# Patient Record
Sex: Male | Born: 1950 | Race: Black or African American | Hispanic: No | Marital: Married | Smoking: Former smoker
Health system: Southern US, Community
[De-identification: ages and names within clinical notes are randomized; demographics above are authoritative.]

## PROBLEM LIST (undated history)

## (undated) ENCOUNTER — Emergency Department (HOSPITAL_COMMUNITY): Payer: No Typology Code available for payment source | Source: Home / Self Care

## (undated) ENCOUNTER — Emergency Department (HOSPITAL_COMMUNITY): Discharge status: Absent without leave | Payer: Self-pay

## (undated) DIAGNOSIS — I10 Essential (primary) hypertension: Secondary | ICD-10-CM

## (undated) DIAGNOSIS — M5030 Other cervical disc degeneration, unspecified cervical region: Secondary | ICD-10-CM

## (undated) HISTORY — PX: NO PAST SURGERIES: SHX2092

---

## 2001-01-11 ENCOUNTER — Emergency Department (HOSPITAL_COMMUNITY): Admission: EM | Admit: 2001-01-11 | Discharge: 2001-01-11 | Payer: Self-pay | Admitting: *Deleted

## 2001-09-23 ENCOUNTER — Encounter: Payer: Self-pay | Admitting: *Deleted

## 2001-09-23 ENCOUNTER — Emergency Department (HOSPITAL_COMMUNITY): Admission: EM | Admit: 2001-09-23 | Discharge: 2001-09-23 | Payer: Self-pay | Admitting: *Deleted

## 2002-01-04 ENCOUNTER — Encounter: Admission: RE | Admit: 2002-01-04 | Discharge: 2002-04-04 | Payer: Self-pay | Admitting: Family Medicine

## 2007-09-24 ENCOUNTER — Emergency Department (HOSPITAL_COMMUNITY): Admission: EM | Admit: 2007-09-24 | Discharge: 2007-09-24 | Payer: Self-pay | Admitting: *Deleted

## 2007-09-29 ENCOUNTER — Emergency Department (HOSPITAL_COMMUNITY): Admission: EM | Admit: 2007-09-29 | Discharge: 2007-09-29 | Payer: Self-pay | Admitting: Emergency Medicine

## 2007-10-03 ENCOUNTER — Emergency Department (HOSPITAL_COMMUNITY): Admission: EM | Admit: 2007-10-03 | Discharge: 2007-10-03 | Payer: Self-pay | Admitting: Emergency Medicine

## 2007-11-22 ENCOUNTER — Emergency Department (HOSPITAL_COMMUNITY): Admission: EM | Admit: 2007-11-22 | Discharge: 2007-11-22 | Payer: Self-pay | Admitting: Emergency Medicine

## 2008-08-19 ENCOUNTER — Emergency Department (HOSPITAL_COMMUNITY): Admission: EM | Admit: 2008-08-19 | Discharge: 2008-08-19 | Payer: Self-pay | Admitting: Emergency Medicine

## 2009-04-06 ENCOUNTER — Emergency Department (HOSPITAL_COMMUNITY): Admission: EM | Admit: 2009-04-06 | Discharge: 2009-04-07 | Payer: Self-pay | Admitting: Emergency Medicine

## 2009-04-08 ENCOUNTER — Emergency Department (HOSPITAL_COMMUNITY): Admission: EM | Admit: 2009-04-08 | Discharge: 2009-04-08 | Payer: Self-pay | Admitting: Emergency Medicine

## 2009-04-19 ENCOUNTER — Emergency Department (HOSPITAL_COMMUNITY): Admission: EM | Admit: 2009-04-19 | Discharge: 2009-04-19 | Payer: Self-pay | Admitting: Emergency Medicine

## 2009-04-27 ENCOUNTER — Emergency Department (HOSPITAL_COMMUNITY): Admission: EM | Admit: 2009-04-27 | Discharge: 2009-04-27 | Payer: Self-pay | Admitting: Emergency Medicine

## 2009-10-06 ENCOUNTER — Emergency Department (HOSPITAL_COMMUNITY): Admission: EM | Admit: 2009-10-06 | Discharge: 2009-10-07 | Payer: Self-pay | Admitting: Emergency Medicine

## 2009-11-12 ENCOUNTER — Emergency Department (HOSPITAL_COMMUNITY): Admission: EM | Admit: 2009-11-12 | Discharge: 2009-11-12 | Payer: Self-pay | Admitting: Family Medicine

## 2009-12-23 ENCOUNTER — Emergency Department (HOSPITAL_COMMUNITY): Admission: EM | Admit: 2009-12-23 | Discharge: 2009-12-23 | Payer: Self-pay | Admitting: Emergency Medicine

## 2010-06-04 ENCOUNTER — Emergency Department (HOSPITAL_COMMUNITY): Admission: EM | Admit: 2010-06-04 | Discharge: 2010-06-04 | Payer: Self-pay | Admitting: Emergency Medicine

## 2010-08-19 ENCOUNTER — Emergency Department (HOSPITAL_COMMUNITY): Admission: EM | Admit: 2010-08-19 | Discharge: 2010-08-19 | Payer: Self-pay | Admitting: Emergency Medicine

## 2010-09-05 ENCOUNTER — Emergency Department (HOSPITAL_COMMUNITY): Admission: EM | Admit: 2010-09-05 | Discharge: 2010-09-05 | Payer: Self-pay | Admitting: Emergency Medicine

## 2011-02-20 LAB — POCT I-STAT, CHEM 8
Calcium, Ion: 1.14 mmol/L (ref 1.12–1.32)
Creatinine, Ser: 0.9 mg/dL (ref 0.4–1.5)
Glucose, Bld: 88 mg/dL (ref 70–99)
HCT: 48 % (ref 33.0–44.0)
Hemoglobin: 16.3 g/dL (ref 11.0–14.6)
Sodium: 139 mEq/L (ref 135–145)

## 2011-02-23 LAB — GLUCOSE, CAPILLARY: Glucose-Capillary: 245 mg/dL — ABNORMAL HIGH (ref 70–99)

## 2011-03-11 LAB — POCT URINALYSIS DIP (DEVICE)
Glucose, UA: 500 mg/dL — AB
Protein, ur: NEGATIVE mg/dL
Urobilinogen, UA: 0.2 mg/dL (ref 0.0–1.0)

## 2011-03-11 LAB — POCT I-STAT, CHEM 8
Calcium, Ion: 1.19 mmol/L (ref 1.12–1.32)
Chloride: 96 mEq/L (ref 96–112)
Glucose, Bld: 412 mg/dL — ABNORMAL HIGH (ref 70–99)
HCT: 50 % (ref 33.0–44.0)
Hemoglobin: 17 g/dL (ref 11.0–14.6)
Potassium: 4.1 mEq/L (ref 3.5–5.1)

## 2012-04-25 ENCOUNTER — Encounter (HOSPITAL_COMMUNITY): Payer: Self-pay | Admitting: Physical Medicine and Rehabilitation

## 2012-04-25 ENCOUNTER — Emergency Department (HOSPITAL_COMMUNITY): Payer: No Typology Code available for payment source

## 2012-04-25 ENCOUNTER — Emergency Department (HOSPITAL_COMMUNITY)
Admission: EM | Admit: 2012-04-25 | Discharge: 2012-04-25 | Disposition: A | Discharge status: Home / Self Care | Payer: No Typology Code available for payment source | Attending: Emergency Medicine | Admitting: Emergency Medicine

## 2012-04-25 DIAGNOSIS — R350 Frequency of micturition: Secondary | ICD-10-CM | POA: Insufficient documentation

## 2012-04-25 DIAGNOSIS — Z7982 Long term (current) use of aspirin: Secondary | ICD-10-CM | POA: Insufficient documentation

## 2012-04-25 DIAGNOSIS — R011 Cardiac murmur, unspecified: Secondary | ICD-10-CM | POA: Insufficient documentation

## 2012-04-25 DIAGNOSIS — M545 Low back pain, unspecified: Secondary | ICD-10-CM | POA: Insufficient documentation

## 2012-04-25 DIAGNOSIS — R209 Unspecified disturbances of skin sensation: Secondary | ICD-10-CM | POA: Insufficient documentation

## 2012-04-25 DIAGNOSIS — Z79899 Other long term (current) drug therapy: Secondary | ICD-10-CM | POA: Insufficient documentation

## 2012-04-25 DIAGNOSIS — R51 Headache: Secondary | ICD-10-CM | POA: Insufficient documentation

## 2012-04-25 DIAGNOSIS — E119 Type 2 diabetes mellitus without complications: Secondary | ICD-10-CM | POA: Insufficient documentation

## 2012-04-25 DIAGNOSIS — I10 Essential (primary) hypertension: Secondary | ICD-10-CM | POA: Insufficient documentation

## 2012-04-25 DIAGNOSIS — M542 Cervicalgia: Secondary | ICD-10-CM | POA: Insufficient documentation

## 2012-04-25 DIAGNOSIS — S161XXA Strain of muscle, fascia and tendon at neck level, initial encounter: Secondary | ICD-10-CM

## 2012-04-25 DIAGNOSIS — S139XXA Sprain of joints and ligaments of unspecified parts of neck, initial encounter: Secondary | ICD-10-CM | POA: Insufficient documentation

## 2012-04-25 HISTORY — DX: Essential (primary) hypertension: I10

## 2012-04-25 MED ORDER — DIAZEPAM 5 MG PO TABS: 5.0000 mg | ORAL_TABLET | Freq: Every day | ORAL | Status: DC

## 2012-04-25 MED ORDER — IBUPROFEN 800 MG PO TABS
800.0000 mg | ORAL_TABLET | Freq: Once | ORAL | Status: AC
  Administered 2012-04-25: 800 mg via ORAL
  Filled 2012-04-25: qty 1

## 2012-04-25 MED ORDER — IBUPROFEN 800 MG PO TABS: 800.0000 mg | ORAL_TABLET | Freq: Three times a day (TID) | ORAL | Status: AC
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
## 2012-04-28 ENCOUNTER — Emergency Department (HOSPITAL_COMMUNITY): Payer: No Typology Code available for payment source

## 2012-04-28 ENCOUNTER — Encounter (HOSPITAL_COMMUNITY): Payer: Self-pay | Admitting: Emergency Medicine

## 2012-04-28 ENCOUNTER — Emergency Department (HOSPITAL_COMMUNITY)
Admission: EM | Admit: 2012-04-28 | Discharge: 2012-04-28 | Disposition: A | Discharge status: Home / Self Care | Payer: No Typology Code available for payment source | Attending: Emergency Medicine | Admitting: Emergency Medicine

## 2012-04-28 DIAGNOSIS — M545 Low back pain, unspecified: Secondary | ICD-10-CM | POA: Insufficient documentation

## 2012-04-28 DIAGNOSIS — M549 Dorsalgia, unspecified: Secondary | ICD-10-CM

## 2012-04-28 DIAGNOSIS — I10 Essential (primary) hypertension: Secondary | ICD-10-CM | POA: Insufficient documentation

## 2012-04-28 DIAGNOSIS — R51 Headache: Secondary | ICD-10-CM | POA: Insufficient documentation

## 2012-04-28 DIAGNOSIS — E119 Type 2 diabetes mellitus without complications: Secondary | ICD-10-CM | POA: Insufficient documentation

## 2012-04-28 LAB — URINALYSIS, ROUTINE W REFLEX MICROSCOPIC
Ketones, ur: NEGATIVE mg/dL
Leukocytes, UA: NEGATIVE
Nitrite: NEGATIVE
Protein, ur: NEGATIVE mg/dL
Urobilinogen, UA: 1 mg/dL (ref 0.0–1.0)

## 2012-04-28 MED ORDER — NAPROXEN 500 MG PO TABS: 500.0000 mg | ORAL_TABLET | Freq: Two times a day (BID) | ORAL | Status: DC

## 2012-04-28 MED ORDER — NAPROXEN 500 MG PO TABS
500.0000 mg | ORAL_TABLET | Freq: Once | ORAL | Status: AC
  Administered 2012-04-28: 500 mg via ORAL
  Filled 2012-04-28: qty 1

## 2012-04-28 MED ORDER — METHOCARBAMOL 500 MG PO TABS: 500.0000 mg | ORAL_TABLET | Freq: Four times a day (QID) | ORAL | Status: AC

## 2012-04-28 MED ORDER — TRAMADOL HCL 50 MG PO TABS: 50.0000 mg | ORAL_TABLET | Freq: Four times a day (QID) | ORAL | Status: AC | PRN

## 2012-04-28 NOTE — ED Notes (Signed)
Patient transported to X-ray 
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
## 2012-04-28 NOTE — Discharge Instructions (Signed)
Your back pain should be treated with medicines such as ibuprofen or aleve and this back pain should get better over the next 2 weeks.  However if you develop severe or worsening pain, low back pain with fever, numbness, weakness or inability to walk or urinate, you should return to the ER immediately.  Please follow up with your doctor this week for a recheck if still having symptoms.  Your x-ray was normal, your urine test showed no signs of infection. Please see the list of physicians below to followup as an outpatient.  RESOURCE GUIDE  Dental Problems  Patients with Medicaid: [facility] [phone] [address]                                           [phone] [address] Phone:  [phone]                                                  Phone:  [phone]  If unable to pay or uninsured, contact:  [facility] or [facility]. to become qualified for the adult dental clinic.  Chronic Pain Problems Contact [facility]  [phone] Patients need to be referred by their primary care doctor.  Insufficient Money for Medicine Contact [employer]:  call "211" or [facility] [phone].  No Primary Care Doctor Call [facility]  [phone] Other agencies that provide inexpensive medical care    [facility]  [phone]    [facility]  [phone]    [facility]  [phone]    [facility]  [phone]    [facility]  [phone]    [facility]  [phone]  Psychological Services [facility]  [phone] [facility]  [phone] [facility]   [phone] (emergency services [phone])  Substance Abuse Resources [facility]  2706533546 [facility] [phone] [facility] [place] [phone] Floydene Flock [phone] Residential & Outpatient Substance Abuse Program   [phone]  Abuse/Neglect [facility] Child Abuse Hotline 760-080-3104 [facility] Child Abuse Hotline 878-869-0669 (After Hours)  Emergency Shelter [facility] [phone]  Maternity Homes [facility] [place] [facility] [place] 5097664711 [facility] [phone]  MRSA Hotline #:   [phone]    [facility] Resources  [facility] [place]     [employer]                          [facility] Dept. [address] [place]                       [address]      [address]  [address]  Sela Hua Phone:  [phone]                                   Phone:  [phone]                 Phone:  [place] Phone:  [place] [phone] (After Hours)
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
## 2012-04-28 NOTE — ED Notes (Signed)
Urine specimen sent to main lab.
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
## 2012-07-01 ENCOUNTER — Encounter (HOSPITAL_COMMUNITY): Payer: Self-pay | Admitting: *Deleted

## 2012-07-01 ENCOUNTER — Emergency Department (HOSPITAL_COMMUNITY)
Admission: EM | Admit: 2012-07-01 | Discharge: 2012-07-01 | Disposition: A | Discharge status: Home / Self Care | Payer: No Typology Code available for payment source | Attending: Emergency Medicine | Admitting: Emergency Medicine

## 2012-07-01 DIAGNOSIS — E119 Type 2 diabetes mellitus without complications: Secondary | ICD-10-CM | POA: Insufficient documentation

## 2012-07-01 DIAGNOSIS — S058X9A Other injuries of unspecified eye and orbit, initial encounter: Secondary | ICD-10-CM | POA: Insufficient documentation

## 2012-07-01 DIAGNOSIS — X58XXXA Exposure to other specified factors, initial encounter: Secondary | ICD-10-CM | POA: Insufficient documentation

## 2012-07-01 DIAGNOSIS — S0500XA Injury of conjunctiva and corneal abrasion without foreign body, unspecified eye, initial encounter: Secondary | ICD-10-CM

## 2012-07-01 DIAGNOSIS — I10 Essential (primary) hypertension: Secondary | ICD-10-CM | POA: Insufficient documentation

## 2012-07-01 DIAGNOSIS — Z87891 Personal history of nicotine dependence: Secondary | ICD-10-CM | POA: Insufficient documentation

## 2012-07-01 MED ORDER — ERYTHROMYCIN 5 MG/GM OP OINT
TOPICAL_OINTMENT | Freq: Once | OPHTHALMIC | Status: AC
  Administered 2012-07-01: 02:00:00 via OPHTHALMIC
  Filled 2012-07-01: qty 1

## 2012-07-01 MED ORDER — FLUORESCEIN SODIUM 1 MG OP STRP
ORAL_STRIP | OPHTHALMIC | Status: AC
  Administered 2012-07-01: 1 via OPHTHALMIC
  Filled 2012-07-01: qty 1

## 2012-07-01 MED ORDER — TETRACAINE HCL 0.5 % OP SOLN
OPHTHALMIC | Status: AC
  Administered 2012-07-01: 02:00:00
  Filled 2012-07-01: qty 2

## 2012-07-01 MED ORDER — FLUORESCEIN SODIUM 1 MG OP STRP
1.0000 | ORAL_STRIP | Freq: Once | OPHTHALMIC | Status: AC
  Administered 2012-07-01: 1 via OPHTHALMIC
  Filled 2012-07-01: qty 1

## 2012-07-01 NOTE — ED Notes (Signed)
Erythromycin being sent to Pod A by pharmacy
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
## 2012-07-02 NOTE — ED Provider Notes (Signed)
Medical screening examination/treatment/procedure(s) were performed by non-physician practitioner and as supervising physician I was immediately available for consultation/collaboration.    Margeaux Swantek D Marceil Welp, MD 07/02/12 1540 

## 2012-11-17 ENCOUNTER — Emergency Department (INDEPENDENT_AMBULATORY_CARE_PROVIDER_SITE_OTHER)
Admission: EM | Admit: 2012-11-17 | Discharge: 2012-11-17 | Disposition: A | Discharge status: Home / Self Care | Payer: Self-pay | Source: Home / Self Care

## 2012-11-17 ENCOUNTER — Encounter (HOSPITAL_COMMUNITY): Payer: Self-pay | Admitting: *Deleted

## 2012-11-17 DIAGNOSIS — J069 Acute upper respiratory infection, unspecified: Secondary | ICD-10-CM

## 2012-11-17 DIAGNOSIS — E119 Type 2 diabetes mellitus without complications: Secondary | ICD-10-CM

## 2012-11-17 DIAGNOSIS — I10 Essential (primary) hypertension: Secondary | ICD-10-CM

## 2012-11-17 DIAGNOSIS — H109 Unspecified conjunctivitis: Secondary | ICD-10-CM

## 2012-11-17 DIAGNOSIS — J329 Chronic sinusitis, unspecified: Secondary | ICD-10-CM

## 2012-11-17 LAB — GLUCOSE, CAPILLARY: Glucose-Capillary: 311 mg/dL — ABNORMAL HIGH (ref 70–99)

## 2012-11-17 MED ORDER — METFORMIN HCL 1000 MG PO TABS: 1000.0000 mg | ORAL_TABLET | Freq: Two times a day (BID) | ORAL | Status: DC

## 2012-11-17 MED ORDER — TRAMADOL HCL 50 MG PO TABS: 50.0000 mg | ORAL_TABLET | Freq: Four times a day (QID) | ORAL | Status: DC | PRN

## 2012-11-17 MED ORDER — QUINAPRIL HCL 20 MG PO TABS: 20.0000 mg | ORAL_TABLET | Freq: Every day | ORAL | Status: DC

## 2012-11-17 MED ORDER — HYDROCHLOROTHIAZIDE 25 MG PO TABS: 25.0000 mg | ORAL_TABLET | Freq: Every day | ORAL | Status: DC

## 2012-11-17 MED ORDER — AMOXICILLIN 500 MG PO CAPS
1000.0000 mg | ORAL_CAPSULE | Freq: Two times a day (BID) | ORAL | Status: DC
Start: 2012-11-17 — End: 2012-11-22

## 2012-11-17 NOTE — ED Provider Notes (Signed)
Medical screening examination/treatment/procedure(s) were performed by non-physician practitioner and as supervising physician I was immediately available for consultation/collaboration.  Leslee Home, M.D.   Reuben Likes, MD 11/17/12 2012

## 2012-11-17 NOTE — ED Notes (Signed)
Call from [facility], asking for clarification on HCTZ rx; spoke directly w provider, information directly to pharmacist; pt to take 1/2 pill daily
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
## 2012-11-17 NOTE — ED Provider Notes (Addendum)
History     CSN: [id number]  Arrival date & time 11/17/12  1256   None     Chief Complaint  Patient presents with  . Dental Pain    (Consider location/radiation/quality/duration/timing/severity/associated sxs/prior treatment) HPI Comments: 61 year old male who has right facial pain and discomfort under the right associated with a possible toothache for several days. Sneezing produces pain into the eye. He also complains of nasal congestion, PND, sneezing and occasional cough. He points to his right upper second molar which is cavernous as a source of pain as well He also states he had a temperature under 1 last night. He has a history of hypertension and diabetes he has not had his antihypertensives for several months and he has been taking family members metformin sporadically.   Past Medical History  Diagnosis Date  . Hypertension   . Diabetes mellitus     History reviewed. No pertinent past surgical history.  No family history on file.  History  Substance Use Topics  . Smoking status: Former Games developer  . Smokeless tobacco: Not on file  . Alcohol Use: Yes      Review of Systems  Constitutional: Positive for fever and activity change. Negative for diaphoresis and fatigue.  HENT: Positive for rhinorrhea, sneezing and postnasal drip. Negative for ear pain, sore throat, facial swelling, trouble swallowing, neck pain and neck stiffness.   Eyes: Negative for pain, discharge and redness.  Respiratory: Positive for cough. Negative for chest tightness and shortness of breath.   Cardiovascular: Negative.   Gastrointestinal: Negative.   Genitourinary: Negative.   Musculoskeletal: Negative.   Neurological: Negative.     Allergies  Review of patient's allergies indicates no known allergies.  Home Medications   Current Outpatient Rx  Name  Route  Sig  Dispense  Refill  . METFORMIN HCL 1000 MG PO TABS   Oral   Take 1,000 mg by mouth 2 (two) times daily with a meal.        . AMOXICILLIN 500 MG PO CAPS   Oral   Take 2 capsules (1,000 mg total) by mouth 2 (two) times daily.   40 capsule   0   . HYDROCHLOROTHIAZIDE 25 MG PO TABS   Oral   Take 1 tablet (25 mg total) by mouth daily. Take 1/2 tablet q d for BP   30 tablet   0   . METFORMIN HCL 1000 MG PO TABS   Oral   Take 1 tablet (1,000 mg total) by mouth 2 (two) times daily. With meals   60 tablet   0   . QUINAPRIL HCL 20 MG PO TABS   Oral   Take 1 tablet (20 mg total) by mouth at bedtime.   30 tablet   0   . TRAMADOL HCL 50 MG PO TABS   Oral   Take 1 tablet (50 mg total) by mouth every 6 (six) hours as needed for pain.   20 tablet   0     BP 121/93  Pulse 100  Temp 99.5 F (37.5 C) (Oral)  Resp 18  SpO2 100%  Physical Exam  Constitutional: He is oriented to person, place, and time. He appears well-developed and well-nourished. No distress.  HENT:       Bilateral TMs are without erythema, effusion, bulging. There may be minimal retraction on the right. Oropharynx with erythematous streaking but no exudates or swelling. Tenderness over the right para nasal sinus.  Eyes: EOM are normal. Pupils are equal, round,  and reactive to light.  Neck: Normal range of motion. Neck supple.  Cardiovascular: Normal rate, regular rhythm and normal heart sounds.   Pulmonary/Chest: Effort normal and breath sounds normal. No respiratory distress. He has no wheezes. He has no rales.  Musculoskeletal: Normal range of motion. He exhibits no edema.  Lymphadenopathy:    He has no cervical adenopathy.  Neurological: He is alert and oriented to person, place, and time. No cranial nerve deficit. Coordination normal.  Skin: Skin is warm and dry. No rash noted.  Psychiatric: He has a normal mood and affect.    ED Course  Procedures (including critical care time)  Labs Reviewed  GLUCOSE, CAPILLARY - Abnormal; Notable for the following:    Glucose-Capillary 311 (*)     All other components within normal  limits   No results found.   1. URI (upper respiratory infection)   2. Sinusitis   3. Conjunctivitis of right eye   4. Diabetes   5. HTN (hypertension)       MDM   Amoxacillin 500 mg 2 caps  twice a day for 10 days Accupril 20 mg one by mouth daily at bedtime for blood pressure Hydrochlorothiazide 25 mg tablet one half tablet every day for blood pressure Metformin 1000 mg one twice a day with food Zaditor eyedrops one drop in the right BID Sudafed PE 10 mg daily for congestion Claritin 10 mg every day when necessary drainage Plenty of water stay well hydrated he will follow with the adult clinic later this week for his chronic illnesses the above remedies for her cold symptoms have been written down on his instruction sheet.         Hayden Rasmussen, NP 11/17/12 1456  Hayden Rasmussen, NP 11/17/12 2033

## 2012-11-18 NOTE — ED Provider Notes (Signed)
Medical screening examination/treatment/procedure(s) were performed by non-physician practitioner and as supervising physician I was immediately available for consultation/collaboration.  Leslee Home, M.D.   Reuben Likes, MD 11/18/12 [phone]

## 2012-11-22 ENCOUNTER — Encounter (HOSPITAL_COMMUNITY): Payer: Self-pay

## 2012-11-22 ENCOUNTER — Emergency Department (INDEPENDENT_AMBULATORY_CARE_PROVIDER_SITE_OTHER)
Admission: EM | Admit: 2012-11-22 | Discharge: 2012-11-22 | Disposition: A | Discharge status: Home / Self Care | Payer: Self-pay | Source: Home / Self Care

## 2012-11-22 DIAGNOSIS — J329 Chronic sinusitis, unspecified: Secondary | ICD-10-CM | POA: Diagnosis present

## 2012-11-22 DIAGNOSIS — E785 Hyperlipidemia, unspecified: Secondary | ICD-10-CM

## 2012-11-22 DIAGNOSIS — I10 Essential (primary) hypertension: Secondary | ICD-10-CM | POA: Diagnosis present

## 2012-11-22 DIAGNOSIS — E119 Type 2 diabetes mellitus without complications: Secondary | ICD-10-CM | POA: Diagnosis present

## 2012-11-22 MED ORDER — OXYMETAZOLINE HCL 0.05 % NA SOLN: 2.0000 | Freq: Two times a day (BID) | NASAL | Status: DC

## 2012-11-22 MED ORDER — CETIRIZINE HCL 10 MG PO TABS: 10.0000 mg | ORAL_TABLET | Freq: Every day | ORAL | Status: DC

## 2012-11-22 MED ORDER — LEVOFLOXACIN 500 MG PO TABS: 500.0000 mg | ORAL_TABLET | Freq: Every day | ORAL | Status: DC

## 2012-11-22 MED ORDER — SODIUM CHLORIDE 0.65 % NA SOLN: 1.0000 | NASAL | Status: DC | PRN

## 2012-11-22 MED ORDER — FLUTICASONE PROPIONATE 50 MCG/ACT NA SUSP: 2.0000 | Freq: Every day | NASAL | Status: DC

## 2012-11-22 MED ORDER — GLIPIZIDE 5 MG PO TABS: 5.0000 mg | ORAL_TABLET | Freq: Two times a day (BID) | ORAL | Status: DC

## 2012-11-22 NOTE — ED Notes (Signed)
Patient states re check has been on antibiotics and not feeling better

## 2012-11-22 NOTE — ED Provider Notes (Signed)
History     CSN: [id number]  Arrival date & time 11/22/12  1545   First MD Initiated Contact with Patient 11/22/12 1632      Chief Complaint  Patient presents with  . Follow-up     HPI - Patient is a very pleasant 61 year old [place] American male with a past medical history of diabetes, hypertension who presents for a followup visit. He was seen recently at the urgent care Center for sinusitis and was given amoxicillin and other supportive care. He claims that he still has fever and feels congested in his right nasal area. He claims to have pain in the right maxillary sinus area as well. He has subjective fever. He has no cough. He has no abdominal pain, nausea, vomiting or diarrhea. He has had only minimal relief  with amoxicillin. He claims that his sugars for the past few weeks have been running in the 300 to 400 range. He claims compliance to medications  Past Medical History  Diagnosis Date  . Hypertension   . Diabetes mellitus     History reviewed. No pertinent past surgical history.- did have dental surgery in his teenage years- after trauma to the dental area  Family history  - No family history of coronary artery disease  History  Substance Use Topics  . Smoking status: Former Games developer  . Smokeless tobacco: Not on file  . Alcohol Use: Yes    Review of Systems + Subjective Fever + Right Maxillary area pain and nasal discharge  no cough No shortness of breath Abdominal pain No nausea, vomiting or diarrhea No chest pain  Allergies  Review of patient's allergies indicates no known allergies.  Home Medications   Current Outpatient Rx  Name  Route  Sig  Dispense  Refill  . CETIRIZINE HCL 10 MG PO TABS   Oral   Take 1 tablet (10 mg total) by mouth daily.   5 tablet   0   . FLUTICASONE PROPIONATE 50 MCG/ACT NA SUSP   Nasal   Place 2 sprays into the nose daily. Take for 5 days and then stop   16 g   2   . GLIPIZIDE 5 MG PO TABS   Oral   Take 1 tablet  (5 mg total) by mouth 2 (two) times daily before a meal.   60 tablet   0   . HYDROCHLOROTHIAZIDE 25 MG PO TABS   Oral   Take 1 tablet (25 mg total) by mouth daily. Take 1/2 tablet q d for BP   30 tablet   0   . LEVOFLOXACIN 500 MG PO TABS   Oral   Take 1 tablet (500 mg total) by mouth daily.   5 tablet   0   . METFORMIN HCL 1000 MG PO TABS   Oral   Take 1,000 mg by mouth 2 (two) times daily with a meal.         . METFORMIN HCL 1000 MG PO TABS   Oral   Take 1 tablet (1,000 mg total) by mouth 2 (two) times daily. With meals   60 tablet   0   . OXYMETAZOLINE HCL 0.05 % NA SOLN   Nasal   Place 2 sprays into the nose 2 (two) times daily. Apply for 3 days only and then stop   30 mL   0   . QUINAPRIL HCL 20 MG PO TABS   Oral   Take 1 tablet (20 mg total) by mouth at bedtime.  30 tablet   0   . SODIUM CHLORIDE 0.65 % NA SOLN   Nasal   Place 1 spray into the nose as needed for congestion.   15 mL   12   . TRAMADOL HCL 50 MG PO TABS   Oral   Take 1 tablet (50 mg total) by mouth every 6 (six) hours as needed for pain.   20 tablet   0     BP 129/78  Pulse 83  Temp 97.9 F (36.6 C) (Oral)  Resp 19  SpO2 100%  Physical Exam Exam-awake alert. not toxic looking. Not in any distress HEENT-atraumatic, normocephalic pupils equally reactive to light and accommodation.  Oral cavity-multiple dental caries, no swelling-no tenderness elicited/seen in the right molar area Neck- supple, no cervical lymphadenopathy. Posterior pharyngeal wall does not appear congested Chest-laterally clear to auscultation Cardiovascular-S1-S2 regular, no murmurs heard Abdomen-soft nontender nondistended Extremities-no edema Neurology-awake and alert, cranial nerves from 2-12 intact. No focal deficits.  ED Course  Procedures (including critical care time)  Labs Reviewed  GLUCOSE, CAPILLARY - Abnormal; Notable for the following:    Glucose-Capillary 335 (*)     All other components  within normal limits   No results found.   1. Sinusitis   2. HTN (hypertension)   3. DM (diabetes mellitus)   4. Dyslipidemia    #1. Sinusitis - Patient has had only minimal relief with amoxicillin, will change to Levaquin. Would also add topical decongestant and steroids. As needed saline nasal spray. Patient is agreeable to try this for approximately a week, if he is still not better by then then we will pursue x-rays and perhaps an ENT referral.  #2. Diabetes - CBGs to the mid 300s, he is already on 1000 mg of metformin twice daily. I will add glipizide. Will check A1c prior to next visit  #3. Hypertension -Reasonably well controlled during today's visit, continue with his current antihypertensive regimen  #4. Dyslipidemia - Will check a lipid panel prior to next visit  #5 Dental caries - Patient to make an appointment with a dentist of his choice in the next few weeks  Health maintenance - Will need to discuss with patient about health maintenance issues-colonoscopy, vaccinations-once he is over with this acute illness.   MDM  Return to clinic in one week- to see if he is getting better        Maretta Bees, MD 11/22/12 [id number]

## 2012-11-29 ENCOUNTER — Emergency Department (INDEPENDENT_AMBULATORY_CARE_PROVIDER_SITE_OTHER)
Admission: EM | Admit: 2012-11-29 | Discharge: 2012-11-29 | Disposition: A | Discharge status: Home / Self Care | Payer: Self-pay | Source: Home / Self Care

## 2012-11-29 DIAGNOSIS — E785 Hyperlipidemia, unspecified: Secondary | ICD-10-CM

## 2012-11-29 DIAGNOSIS — E119 Type 2 diabetes mellitus without complications: Secondary | ICD-10-CM

## 2012-11-29 DIAGNOSIS — I10 Essential (primary) hypertension: Secondary | ICD-10-CM

## 2012-11-29 LAB — COMPREHENSIVE METABOLIC PANEL
AST: 17 U/L (ref 0–37)
Albumin: 3.6 g/dL (ref 3.5–5.2)
BUN: 8 mg/dL (ref 6–23)
Calcium: 9.6 mg/dL (ref 8.4–10.5)
Creatinine, Ser: 0.76 mg/dL (ref 0.50–1.35)
Total Bilirubin: 0.4 mg/dL (ref 0.3–1.2)
Total Protein: 7.5 g/dL (ref 6.0–8.3)

## 2012-11-29 LAB — CHOLESTEROL, TOTAL: Cholesterol: 201 mg/dL — ABNORMAL HIGH (ref 0–200)

## 2012-11-29 LAB — TSH: TSH: 2.877 u[IU]/mL (ref 0.350–4.500)

## 2012-11-29 LAB — HEMOGLOBIN A1C
Hgb A1c MFr Bld: 11.8 % — ABNORMAL HIGH (ref <5.7)
Mean Plasma Glucose: 292 mg/dL — ABNORMAL HIGH (ref <117)

## 2012-12-06 ENCOUNTER — Telehealth (HOSPITAL_COMMUNITY): Payer: Self-pay

## 2012-12-06 NOTE — Telephone Encounter (Signed)
Message copied by Lestine Mount on Mon Dec 06, 2012  5:59 PM ------      Message from: Vassie Moselle      Created: Thu Dec 02, 2012  1:39 PM      Regarding: labs                   ----- Message -----         From: Lab In [place] Interface         Sent: 11/29/2012  11:38 AM           To: [facility] Follow Up

## 2012-12-14 ENCOUNTER — Encounter (HOSPITAL_COMMUNITY): Payer: Self-pay

## 2012-12-14 ENCOUNTER — Emergency Department (HOSPITAL_COMMUNITY)
Admission: EM | Admit: 2012-12-14 | Discharge: 2012-12-14 | Disposition: A | Discharge status: Home / Self Care | Payer: Self-pay | Source: Home / Self Care

## 2012-12-14 DIAGNOSIS — J329 Chronic sinusitis, unspecified: Secondary | ICD-10-CM

## 2012-12-14 DIAGNOSIS — E119 Type 2 diabetes mellitus without complications: Secondary | ICD-10-CM

## 2012-12-14 DIAGNOSIS — I10 Essential (primary) hypertension: Secondary | ICD-10-CM

## 2012-12-14 MED ORDER — LEVOFLOXACIN 500 MG PO TABS: 500.0000 mg | ORAL_TABLET | Freq: Every day | ORAL | Status: DC

## 2012-12-14 MED ORDER — OXYMETAZOLINE HCL 0.05 % NA SOLN: 2.0000 | Freq: Two times a day (BID) | NASAL | Status: DC

## 2012-12-14 NOTE — ED Notes (Signed)
Follow up-re check sinus infection. Patient states still not feeling better, headache, congestion.  Also needs medication refills

## 2012-12-20 ENCOUNTER — Telehealth (HOSPITAL_COMMUNITY): Payer: Self-pay

## 2012-12-30 NOTE — ED Provider Notes (Signed)
History     CSN: [id number]  Arrival date & time 12/14/12  1154  Chief Complaint  Patient presents with  . Follow-up   (Consider location/radiation/quality/duration/timing/severity/associated sxs/prior treatment) The history is provided by the patient.   Pt presents to follow up.  He says he is having persistent cough, congestion, sinus pressure and sinus pain.  No fever or chills, No SOB.    Past Medical History  Diagnosis Date  . Hypertension   . Diabetes mellitus     History reviewed. No pertinent past surgical history.  No family history on file.  History  Substance Use Topics  . Smoking status: Former Games developer  . Smokeless tobacco: Not on file  . Alcohol Use: Yes    Review of Systems  HENT: Positive for congestion, rhinorrhea and postnasal drip.        Sinus pain   Respiratory: Positive for cough.   All other systems reviewed and are negative.    Allergies  Review of patient's allergies indicates no known allergies.  Home Medications   Current Outpatient Rx  Name  Route  Sig  Dispense  Refill  . CETIRIZINE HCL 10 MG PO TABS   Oral   Take 1 tablet (10 mg total) by mouth daily.   5 tablet   0   . FLUTICASONE PROPIONATE 50 MCG/ACT NA SUSP   Nasal   Place 2 sprays into the nose daily. Take for 5 days and then stop   16 g   2   . GLIPIZIDE 5 MG PO TABS   Oral   Take 1 tablet (5 mg total) by mouth 2 (two) times daily before a meal.   60 tablet   0   . HYDROCHLOROTHIAZIDE 25 MG PO TABS   Oral   Take 1 tablet (25 mg total) by mouth daily. Take 1/2 tablet q d for BP   30 tablet   0   . LEVOFLOXACIN 500 MG PO TABS   Oral   Take 1 tablet (500 mg total) by mouth daily.   10 tablet   0   . METFORMIN HCL 1000 MG PO TABS   Oral   Take 1,000 mg by mouth 2 (two) times daily with a meal.         . METFORMIN HCL 1000 MG PO TABS   Oral   Take 1 tablet (1,000 mg total) by mouth 2 (two) times daily. With meals   60 tablet   0   . OXYMETAZOLINE HCL  0.05 % NA SOLN   Nasal   Place 2 sprays into the nose 2 (two) times daily. Apply for 3 days only and then stop   30 mL   0   . QUINAPRIL HCL 20 MG PO TABS   Oral   Take 1 tablet (20 mg total) by mouth at bedtime.   30 tablet   0   . SODIUM CHLORIDE 0.65 % NA SOLN   Nasal   Place 1 spray into the nose as needed for congestion.   15 mL   12   . TRAMADOL HCL 50 MG PO TABS   Oral   Take 1 tablet (50 mg total) by mouth every 6 (six) hours as needed for pain.   20 tablet   0     BP 131/89  Pulse 75  Temp 98.2 F (36.8 C)  Resp 19  SpO2 100%  Physical Exam  Nursing note and vitals reviewed. Constitutional: He is oriented to person,  place, and time. He appears well-developed and well-nourished.  HENT:  Head: Normocephalic and atraumatic.  Nose: Mucosal edema, rhinorrhea and sinus tenderness present. Right sinus exhibits frontal sinus tenderness. Left sinus exhibits frontal sinus tenderness.  Cardiovascular: Normal rate, regular rhythm and normal heart sounds.   Neurological: He is alert and oriented to person, place, and time.  Skin: Skin is warm and dry.  Psychiatric: He has a normal mood and affect. His behavior is normal. Judgment and thought content normal.    ED Course  Procedures (including critical care time)  Labs Reviewed - No data to display No results found.   1. Sinusitis   2. HTN (hypertension)   3. DM (diabetes mellitus) type 2 poorly controlled     MDM  Prescribed levaquin, afrin nasal spray to use up to 3 days max, continue monitoring BS closely, take medications as prescribed  The patient was given clear instructions to go to ER or return to medical center if symptoms don't improve, worsen or new problems develop.  The patient verbalized understanding.  The patient was told to call to get lab results if they haven't heard anything in the next week.           Cleora Fleet, MD 12/30/12 1008

## 2013-01-14 ENCOUNTER — Encounter (HOSPITAL_COMMUNITY): Payer: Self-pay

## 2013-01-14 ENCOUNTER — Emergency Department (INDEPENDENT_AMBULATORY_CARE_PROVIDER_SITE_OTHER)
Admission: EM | Admit: 2013-01-14 | Discharge: 2013-01-14 | Disposition: A | Discharge status: Home / Self Care | Payer: Self-pay | Source: Home / Self Care | Attending: Family Medicine | Admitting: Family Medicine

## 2013-01-14 DIAGNOSIS — I10 Essential (primary) hypertension: Secondary | ICD-10-CM

## 2013-01-14 DIAGNOSIS — E119 Type 2 diabetes mellitus without complications: Secondary | ICD-10-CM

## 2013-01-14 DIAGNOSIS — J329 Chronic sinusitis, unspecified: Secondary | ICD-10-CM

## 2013-01-14 DIAGNOSIS — J309 Allergic rhinitis, unspecified: Secondary | ICD-10-CM

## 2013-01-14 MED ORDER — DOXYCYCLINE HYCLATE 100 MG PO TABS: 100.0000 mg | ORAL_TABLET | Freq: Two times a day (BID) | ORAL | Status: DC

## 2013-01-14 MED ORDER — CETIRIZINE HCL 10 MG PO TABS: 10.0000 mg | ORAL_TABLET | Freq: Every day | ORAL | Status: DC

## 2013-01-14 MED ORDER — ACIDOPHILUS PROBIOTIC 100 MG PO CAPS: 1.0000 | ORAL_CAPSULE | Freq: Three times a day (TID) | ORAL | Status: DC

## 2013-01-14 NOTE — ED Provider Notes (Signed)
History     CSN: [id number]  Arrival date & time 01/14/13  1459   First MD Initiated Contact with Patient 01/14/13 1554      Chief Complaint  Patient presents with  . Headache   HPI The patient is presenting today for sinus headache.  He reports that he took the antibiotics and the sinuses improved but when he finished the antibiotics a couple of days later the sinus pain and pressure return.  He reports she's still feeling a nasty taste in his mouth that has returned.  He reports that he is having frontal sinus pressure and some maxillary sinus pressure and pain.  The patient reports that his blood sugars have been improving.  He reports that he only took this cetirizine for a five-day course.  He hasn't taken it since that time in December when it was prescribed.  The patient reports that he did use the nasal sprays that were prescribed.  He says that he would like to see an ENT doctor but because he doesn't have medical insurance he's not able to afford a very expensive bill from a specialist.   Past Medical History  Diagnosis Date  . Hypertension   . Diabetes mellitus    History reviewed. No pertinent past surgical history.  FAMILY HISTORY -  hypertension  History  Substance Use Topics  . Smoking status: Former Games developer  . Smokeless tobacco: Not on file  . Alcohol Use: Yes    Review of Systems  HENT: Positive for congestion, rhinorrhea, sneezing and postnasal drip.   Neurological: Positive for headaches.  All other systems reviewed and are negative.    Allergies  Review of patient's allergies indicates no known allergies.  Home Medications   Current Outpatient Rx  Name  Route  Sig  Dispense  Refill  . CETIRIZINE HCL 10 MG PO TABS   Oral   Take 1 tablet (10 mg total) by mouth daily.   5 tablet   0   . FLUTICASONE PROPIONATE 50 MCG/ACT NA SUSP   Nasal   Place 2 sprays into the nose daily. Take for 5 days and then stop   16 g   2   . GLIPIZIDE 5 MG PO TABS  Oral   Take 1 tablet (5 mg total) by mouth 2 (two) times daily before a meal.   60 tablet   0   . HYDROCHLOROTHIAZIDE 25 MG PO TABS   Oral   Take 1 tablet (25 mg total) by mouth daily. Take 1/2 tablet q d for BP   30 tablet   0   . LEVOFLOXACIN 500 MG PO TABS   Oral   Take 1 tablet (500 mg total) by mouth daily.   10 tablet   0   . METFORMIN HCL 1000 MG PO TABS   Oral   Take 1,000 mg by mouth 2 (two) times daily with a meal.         . METFORMIN HCL 1000 MG PO TABS   Oral   Take 1 tablet (1,000 mg total) by mouth 2 (two) times daily. With meals   60 tablet   0   . OXYMETAZOLINE HCL 0.05 % NA SOLN   Nasal   Place 2 sprays into the nose 2 (two) times daily. Apply for 3 days only and then stop   30 mL   0   . QUINAPRIL HCL 20 MG PO TABS   Oral   Take 1 tablet (20 mg total)  by mouth at bedtime.   30 tablet   0   . SODIUM CHLORIDE 0.65 % NA SOLN   Nasal   Place 1 spray into the nose as needed for congestion.   15 mL   12   . TRAMADOL HCL 50 MG PO TABS   Oral   Take 1 tablet (50 mg total) by mouth every 6 (six) hours as needed for pain.   20 tablet   0     BP 135/86  Pulse 93  Temp 98.6 F (37 C) (Oral)  Resp 16  SpO2 100%  Physical Exam  Nursing note and vitals reviewed. Constitutional: He is oriented to person, place, and time. He appears well-developed and well-nourished. No distress.  HENT:  Head: Normocephalic and atraumatic.  Eyes: Conjunctivae normal and EOM are normal. Pupils are equal, round, and reactive to light.  Neck: Normal range of motion. Neck supple. No JVD present. No tracheal deviation present. No thyromegaly present.  Cardiovascular: Normal rate, regular rhythm and normal heart sounds.   Pulmonary/Chest: Effort normal and breath sounds normal. No respiratory distress. He has no wheezes. He has no rales. He exhibits no tenderness.  Abdominal: Soft. Bowel sounds are normal. He exhibits no distension and no mass. There is no tenderness.  There is no rebound and no guarding.  Musculoskeletal: Normal range of motion.  Lymphadenopathy:    He has no cervical adenopathy.  Neurological: He is alert and oriented to person, place, and time.  Skin: Skin is warm and dry.  Psychiatric: He has a normal mood and affect. His behavior is normal. Judgment and thought content normal.    ED Course  Procedures (including critical care time)  Labs Reviewed - No data to display No results found.  No diagnosis found.  MDM  IMPRESSION  Sinusitis  Diabetes mellitus type 2   Allergic rhinitis   RECOMMENDATIONS / PLAN Doxycycline 100 mg po bid with food, #24 ENT referral for evaluation  The patient is going to try Neti-Pot as well I told him that if there is no significant improvement after next week then we have to try and get him into an ENT.  I asked them to please try and get an orange card for a discount.  He verbalized understanding. Continue to monitor blood glucose closely  FOLLOW UP 2 weeks  The patient was given clear instructions to go to ER or return to medical center if symptoms don't improve, worsen or new problems develop.  The patient verbalized understanding.  The patient was told to call to get lab results if they haven't heard anything in the next week.            Cleora Fleet, MD 01/14/13 1843

## 2013-01-14 NOTE — ED Notes (Signed)
Patient states has been being treated for sinus issue but still presents with a "banging" headach

## 2013-02-25 ENCOUNTER — Emergency Department (HOSPITAL_COMMUNITY): Payer: BC Managed Care – PPO

## 2013-02-25 ENCOUNTER — Emergency Department (INDEPENDENT_AMBULATORY_CARE_PROVIDER_SITE_OTHER)
Admission: EM | Admit: 2013-02-25 | Discharge: 2013-02-25 | Disposition: A | Discharge status: Nursing Home (Includes ICF) | Payer: Self-pay | Source: Home / Self Care | Attending: Emergency Medicine | Admitting: Emergency Medicine

## 2013-02-25 ENCOUNTER — Encounter (HOSPITAL_COMMUNITY): Payer: Self-pay | Admitting: *Deleted

## 2013-02-25 ENCOUNTER — Emergency Department (INDEPENDENT_AMBULATORY_CARE_PROVIDER_SITE_OTHER): Payer: Self-pay

## 2013-02-25 ENCOUNTER — Encounter (HOSPITAL_COMMUNITY): Payer: Self-pay | Admitting: Emergency Medicine

## 2013-02-25 ENCOUNTER — Emergency Department (HOSPITAL_COMMUNITY)
Admission: EM | Admit: 2013-02-25 | Discharge: 2013-02-25 | Disposition: A | Discharge status: Home / Self Care | Payer: BC Managed Care – PPO | Attending: Emergency Medicine | Admitting: Emergency Medicine

## 2013-02-25 DIAGNOSIS — J3489 Other specified disorders of nose and nasal sinuses: Secondary | ICD-10-CM

## 2013-02-25 DIAGNOSIS — Z7982 Long term (current) use of aspirin: Secondary | ICD-10-CM | POA: Insufficient documentation

## 2013-02-25 DIAGNOSIS — J321 Chronic frontal sinusitis: Secondary | ICD-10-CM

## 2013-02-25 DIAGNOSIS — E119 Type 2 diabetes mellitus without complications: Secondary | ICD-10-CM | POA: Insufficient documentation

## 2013-02-25 DIAGNOSIS — Z79899 Other long term (current) drug therapy: Secondary | ICD-10-CM | POA: Insufficient documentation

## 2013-02-25 DIAGNOSIS — J329 Chronic sinusitis, unspecified: Secondary | ICD-10-CM

## 2013-02-25 DIAGNOSIS — I10 Essential (primary) hypertension: Secondary | ICD-10-CM | POA: Insufficient documentation

## 2013-02-25 DIAGNOSIS — Z87891 Personal history of nicotine dependence: Secondary | ICD-10-CM | POA: Insufficient documentation

## 2013-02-25 DIAGNOSIS — J340 Abscess, furuncle and carbuncle of nose: Secondary | ICD-10-CM

## 2013-02-25 MED ORDER — AMOXICILLIN-POT CLAVULANATE 875-125 MG PO TABS
1.0000 | ORAL_TABLET | Freq: Once | ORAL | Status: AC
Start: 2013-02-25 — End: 2013-02-25
  Administered 2013-02-25: 1 via ORAL
  Filled 2013-02-25: qty 1

## 2013-02-25 MED ORDER — FLUTICASONE PROPIONATE 50 MCG/ACT NA SUSP
2.0000 | Freq: Every day | NASAL | Status: DC
  Administered 2013-02-25: 2 via NASAL
  Filled 2013-02-25 (×2): qty 16

## 2013-02-25 MED ORDER — PREDNISONE 20 MG PO TABS: 40.0000 mg | ORAL_TABLET | Freq: Every day | ORAL | Status: DC

## 2013-02-25 MED ORDER — AMOXICILLIN-POT CLAVULANATE 875-125 MG PO TABS: 1.0000 | ORAL_TABLET | Freq: Two times a day (BID) | ORAL | Status: DC

## 2013-02-25 MED ORDER — LEVOFLOXACIN 250 MG PO TABS: 500.0000 mg | ORAL_TABLET | Freq: Once | ORAL | Status: DC

## 2013-02-25 NOTE — ED Notes (Signed)
Patient states went to urgent care earlier today and they advised that he has a bad sinus infection and may need to get a CT here.  Patient states that the R side of his face hurts and has a lot of sinus buildup.

## 2013-02-25 NOTE — ED Notes (Signed)
Pt reports 3 months of right sinus pain and pressure.  He took antibiotics 2 times without relief.  He denies fever

## 2013-02-25 NOTE — ED Provider Notes (Addendum)
History     CSN: [id number]  Arrival date & time 02/25/13  1128   First MD Initiated Contact with Patient 02/25/13 1143      Chief Complaint  Patient presents with  . Facial Pain    (Consider location/radiation/quality/duration/timing/severity/associated sxs/prior treatment) HPI Comments: Patient presents this morning to urgent care, to followup on the ongoing problem that he's been having for approximately 3 months. Patient described that he's been having pressure, discomfort and frequent streaks of blood with mucoid material out of his right nostril for approximately 3 months. He has been seen by his primary care Dr. recently and hasn't completed and antibiotics 2 times. Without any relief. He denies any fevers but describes continues to feel pressure on the right side lateral aspect of his right nostril up to his ( nasal bridge region) ( patient points to right ethmoidal and frontal sinus region). Patient denies any fevers, or other constitutional symptoms such as body aches, malaise, skin rashes or further neurological symptoms such as facial paralysis or other paresthesias.  The history is provided by the patient.    Past Medical History  Diagnosis Date  . Hypertension   . Diabetes mellitus     History reviewed. No pertinent past surgical history.  Family History  Problem Relation Age of Onset  . Hypertension Mother   . Diabetes Mother     History  Substance Use Topics  . Smoking status: Former Games developer  . Smokeless tobacco: Not on file  . Alcohol Use: Yes      Review of Systems  Constitutional: Negative for activity change and appetite change.  HENT: Positive for nosebleeds, congestion and rhinorrhea. Negative for ear pain, sneezing, neck pain, neck stiffness, postnasal drip, tinnitus and ear discharge.   Skin: Negative for rash.  Neurological: Negative for dizziness and headaches.    Allergies  Review of patient's allergies indicates no known allergies.  Home  Medications   Current Outpatient Rx  Name  Route  Sig  Dispense  Refill  . cetirizine (ZYRTEC) 10 MG tablet   Oral   Take 1 tablet (10 mg total) by mouth daily.   30 tablet   2   . glipiZIDE (GLUCOTROL) 5 MG tablet   Oral   Take 1 tablet (5 mg total) by mouth 2 (two) times daily before a meal.   60 tablet   0   . hydrochlorothiazide (HYDRODIURIL) 25 MG tablet   Oral   Take 1 tablet (25 mg total) by mouth daily. Take 1/2 tablet q d for BP   30 tablet   0   . metFORMIN (GLUCOPHAGE) 1000 MG tablet   Oral   Take 1,000 mg by mouth 2 (two) times daily with a meal.         . quinapril (ACCUPRIL) 20 MG tablet   Oral   Take 1 tablet (20 mg total) by mouth at bedtime.   30 tablet   0   . sodium chloride (OCEAN) 0.65 % nasal spray   Nasal   Place 1 spray into the nose as needed for congestion.   15 mL   12   . doxycycline (VIBRA-TABS) 100 MG tablet   Oral   Take 1 tablet (100 mg total) by mouth 2 (two) times daily.   24 tablet   0   . fluticasone (FLONASE) 50 MCG/ACT nasal spray   Nasal   Place 2 sprays into the nose daily. Take for 5 days and then stop   16 g  2   . Lactobacillus (ACIDOPHILUS PROBIOTIC) 100 MG CAPS   Oral   Take 1 capsule (100 mg total) by mouth 3 (three) times daily with meals.   90 capsule   0   . metFORMIN (GLUCOPHAGE) 1000 MG tablet   Oral   Take 1 tablet (1,000 mg total) by mouth 2 (two) times daily. With meals   60 tablet   0   . oxymetazoline (AFRIN NASAL SPRAY) 0.05 % nasal spray   Nasal   Place 2 sprays into the nose 2 (two) times daily. Apply for 3 days only and then stop   30 mL   0     BP 133/90  Pulse 80  Temp(Src) 97 F (36.1 C) (Oral)  Resp 20  SpO2 97%  Physical Exam  Nursing note and vitals reviewed. Constitutional: He is oriented to person, place, and time. Vital signs are normal. He appears well-developed.  Non-toxic appearance. He does not have a sickly appearance. He does not appear ill. No distress.  HENT:   Head: Normocephalic.  Right Ear: Tympanic membrane and ear canal normal. No drainage or swelling. Tympanic membrane is not injected. No decreased hearing is noted.  Left Ear: Tympanic membrane and ear canal normal. No drainage or swelling. Tympanic membrane is not injected. No decreased hearing is noted.  Nose: Mucosal edema and sinus tenderness present. No rhinorrhea, nose lacerations, septal deviation or nasal septal hematoma. No epistaxis.  No foreign bodies. Right sinus exhibits frontal sinus tenderness. Right sinus exhibits no maxillary sinus tenderness. Left sinus exhibits no frontal sinus tenderness.    Mouth/Throat: No oropharyngeal exudate.  Eyes: Conjunctivae are normal. Pupils are equal, round, and reactive to light. Right eye exhibits no discharge. Left eye exhibits no discharge. No scleral icterus.  Neck: Neck supple. No JVD present.  Lymphadenopathy:    He has no cervical adenopathy.  Neurological: He is alert and oriented to person, place, and time.  Skin: No rash noted.    ED Course  Procedures (including critical care time)  Labs Reviewed - No data to display Dg Sinuses Complete  02/25/2013  *RADIOLOGY REPORT*  Clinical Data: Facial pain  PARANASAL SINUSES - COMPLETE 3 + VIEW  Comparison: None.  Findings: Water's, frontal, lateral, and submental vertex views were obtained.  There is extensive opacification of the right maxillary antrum.  There is extensive opacification of the ethmoid air cells on the right as well as most of the right frontal sinus region.  There is also some opacification of the more medial aspects of the left frontal sinus region.  Sphenoid sinuses appear clear.  There is no air-fluid level.  No bony destruction or expansion. There is slight rightward deviation of the nasal septum.  IMPRESSION: Extensive sinusitis on the right with near complete opacification of the right maxillary, ethmoid, and frontal sinuses.  There is also opacification of more medial  frontal sinus regions on the left.  No bony abnormality.  No air-fluid levels.   Original Report Authenticated By: Bretta Bang, M.D.      1. Sinusitis chronic, frontal   2. Sinusitis, chronic   3. Nasal septal abscess       MDM   Patient symptomatic for approximately 3 months with severe maxillary and frontal sinusitis with an intranasal abscess. Patient seemed to be a candidate for further imaging studies and perhaps ENT involvement. Patient will be transferred to the emergency department for further workup. Patient has failed to antibiotic treatments outpatient.   Problem #1  complicated sinusitis ( diabetic patient) with associated intranasal abscess- failing ambulatory treatment with antibiotics cycles     Jimmie Molly, MD 02/25/13 1401  Jimmie Molly, MD 02/25/13 1408

## 2013-02-25 NOTE — ED Provider Notes (Signed)
History    This chart was scribed for non-physician practitioner working with Nelia Shi, MD by Donne Anon, ED Scribe. This patient was seen in room TR11C/TR11C and the patient's care was started at 1546.   CSN: [id number]  Arrival date & time 02/25/13  1418   First MD Initiated Contact with Patient 02/25/13 1546      Chief Complaint  Patient presents with  . Nasal Congestion     The history is provided by the patient. No language interpreter was used.   Andre Reed is a 62 y.o. male who presents to the Emergency Department complaining of gradual onset, constant , gradually worsening moderate frontal nasal congestion which began 3 months ago and is described as pressure with associated intermittent shooting pain. Pt was seen this morning in urgent care and was referred to the ED for further workup. Pt states he had the flu 3 months ago and his congestion never cleared up. He denies fever, ear pain or any other pain. He has tried Zithromax two times (7 days, 14 days) and Doxycyclin one time with no relief. He has not tried decongestants.  Past Medical History  Diagnosis Date  . Hypertension   . Diabetes mellitus     History reviewed. No pertinent past surgical history.  Family History  Problem Relation Age of Onset  . Hypertension Mother   . Diabetes Mother     History  Substance Use Topics  . Smoking status: Former Games developer  . Smokeless tobacco: Not on file  . Alcohol Use: Yes      Review of Systems  Constitutional: Negative for fever.  HENT: Positive for congestion. Negative for ear pain.   All other systems reviewed and are negative.    Allergies  Review of patient's allergies indicates no known allergies.  Home Medications   Current Outpatient Rx  Name  Route  Sig  Dispense  Refill  . aspirin EC 81 MG tablet   Oral   Take 81 mg by mouth daily.         . cetirizine (ZYRTEC) 10 MG tablet   Oral   Take 10 mg by mouth daily.         Marland Kitchen  glipiZIDE (GLUCOTROL) 5 MG tablet   Oral   Take 5 mg by mouth 2 (two) times daily before a meal.         . hydrochlorothiazide (HYDRODIURIL) 25 MG tablet   Oral   Take 12.5 mg by mouth daily. Take 1/2 tablet q d for BP         . Lactobacillus (ACIDOPHILUS PROBIOTIC) 100 MG CAPS   Oral   Take 1 capsule by mouth 3 (three) times daily with meals.         . metFORMIN (GLUCOPHAGE) 1000 MG tablet   Oral   Take 1,000 mg by mouth 2 (two) times daily with a meal.         . oxymetazoline (AFRIN) 0.05 % nasal spray   Nasal   Place 2 sprays into the nose 2 (two) times daily. Apply for 3 days only and then stop         . quinapril (ACCUPRIL) 20 MG tablet   Oral   Take 20 mg by mouth daily.         . sodium chloride (OCEAN) 0.65 % nasal spray   Nasal   Place 1 spray into the nose as needed for congestion.  BP 130/81  Pulse 82  Temp(Src) 98.1 F (36.7 C) (Oral)  Resp 16  SpO2 99%  Physical Exam  Nursing note and vitals reviewed. Constitutional: He is oriented to person, place, and time. He appears well-developed and well-nourished. No distress.  HENT:  Head: Normocephalic and atraumatic.  Swelling over right nostril over septum. Tendness over maxillary and frontal sinus of right side.  Eyes: EOM are normal.  Neck: Neck supple. No tracheal deviation present.  Cardiovascular: Normal rate.   Pulmonary/Chest: Effort normal. No respiratory distress.  Musculoskeletal: Normal range of motion.  Neurological: He is alert and oriented to person, place, and time.  Skin: Skin is warm and dry.  Psychiatric: He has a normal mood and affect. His behavior is normal.    ED Course  Procedures (including critical care time) DIAGNOSTIC STUDIES: Oxygen Saturation is 99% on room air, normal by my interpretation.    COORDINATION OF CARE: 4:12 PM Discussed treatment plan which includes consult with ENT specialist with pt at bedside and pt agreed to plan.   5:04 PM Spoke  with Dr. Suszanne Conners, ENT, who advised to CT without contrast and starting him on Levaquin. Have pt follow up in his office on Monday if CT negative.   Labs Reviewed - No data to display Dg Sinuses Complete  02/25/2013  *RADIOLOGY REPORT*  Clinical Data: Facial pain  PARANASAL SINUSES - COMPLETE 3 + VIEW  Comparison: None.  Findings: Water's, frontal, lateral, and submental vertex views were obtained.  There is extensive opacification of the right maxillary antrum.  There is extensive opacification of the ethmoid air cells on the right as well as most of the right frontal sinus region.  There is also some opacification of the more medial aspects of the left frontal sinus region.  Sphenoid sinuses appear clear.  There is no air-fluid level.  No bony destruction or expansion. There is slight rightward deviation of the nasal septum.  IMPRESSION: Extensive sinusitis on the right with near complete opacification of the right maxillary, ethmoid, and frontal sinuses.  There is also opacification of more medial frontal sinus regions on the left.  No bony abnormality.  No air-fluid levels.   Original Report Authenticated By: Bretta Bang, M.D.     Results for orders placed during the hospital encounter of 11/29/12  TSH      Result Value Range   TSH 2.877  0.350 - 4.500 uIU/mL  CHOLESTEROL, TOTAL      Result Value Range   Cholesterol 201 (*) 0 - 200 mg/dL  COMPREHENSIVE METABOLIC PANEL      Result Value Range   Sodium 136  135 - 145 mEq/L   Potassium 3.9  3.5 - 5.1 mEq/L   Chloride 99  96 - 112 mEq/L   CO2 27  19 - 32 mEq/L   Glucose, Bld 152 (*) 70 - 99 mg/dL   BUN 8  6 - 23 mg/dL   Creatinine, Ser 1.61  0.50 - 1.35 mg/dL   Calcium 9.6  8.4 - 09.6 mg/dL   Total Protein 7.5  6.0 - 8.3 g/dL   Albumin 3.6  3.5 - 5.2 g/dL   AST 17  0 - 37 U/L   ALT 22  0 - 53 U/L   Alkaline Phosphatase 64  39 - 117 U/L   Total Bilirubin 0.4  0.3 - 1.2 mg/dL   GFR calc non Af Amer >90  >90 mL/min   GFR calc Af Amer  >90  >90 mL/min  HEMOGLOBIN A1C      Result Value Range   Hemoglobin A1C 11.8 (*) <5.7 %   Mean Plasma Glucose 292 (*) <117 mg/dL    Ct Maxillofacial Wo Cm  02/25/2013  *RADIOLOGY REPORT*  Clinical Data: Sinus infection,  CT MAXILLOFACIAL WITHOUT CONTRAST  Technique:  Multidetector CT imaging of the maxillofacial structures was performed. Multiplanar CT image reconstructions were also generated.  Comparison: 09/05/2010  Findings: Axial images shows mucosal thickening with complete opacification of the right maxillary sinus.  Findings are highly suspicious for acute sinusitis. Clinical correlation is necessary. Minimal right nasal septum deviation.  The nasal bones are unremarkable.  No facial fluid or facial fluid collection.  The mastoid air cells are unremarkable.  Bilateral eye globes are symmetrical in appearance.  Bilateral orbits are unremarkable.  There is mucosal thickening with almost complete opacification of the right anterior ethmoid air cells. Mucosal thickening with complete opacification noted right frontal sinus.  Sagittal images shows patent nasopharyngeal airway.  The oral pharyngeal airway is patent. Coronal images shows complete obstruction of the right semilunar canal.  The left semilunar canal is patent.  IMPRESSION:  1.  There is mucosal thickening with complete opacification of the right maxillary sinus.  Mucosal thickening with opacification anterior aspect of the right ethmoid air cells.  Mucosal thickening with complete opacification of the right frontal sinus.  Findings are highly suspicious for acute sinusitis. 2.  Patent oral pharyngeal and nasopharyngeal airway.  Minimal right nasal septum deviation.   Original Report Authenticated By: Natasha Mead, M.D.      1. Sinusitis       MDM  Pt with Right sinusitis. Will start on agumentin per Dr. Avel Sensor request, it appears pt has already had a course of levaquin. flonase given in ED. Steroids. Instructed to keep close eye on his  blood sugars. Follow up with Dr. Suszanne Conners in the office on Monday. Pt is afebrile, otherwise non toxic.   Filed Vitals:   02/25/13 1917  BP: 143/88  Pulse: 78  Temp: 98.4 F (36.9 C)  Resp:     I personally performed the services described in this documentation, which was scribed in my presence. The recorded information has been reviewed and is accurate.      Lottie Mussel, PA-C 02/25/13 2004

## 2013-02-25 NOTE — ED Notes (Signed)
Patient requests something to eat and drink.  

## 2013-02-25 NOTE — ED Notes (Signed)
Onset 3 months ago nasal congestion and states given antibiotics 3 different times seen at urgent care and today was sent to ED for evaluation. States nasal pressure constant with intermittent shooting pain.

## 2013-02-25 NOTE — ED Notes (Signed)
Patient is alert and orientedx4.  Patient was explained discharge instructions and they understood them with no questions.   

## 2013-02-26 NOTE — ED Provider Notes (Signed)
Medical screening examination/treatment/procedure(s) were performed by non-physician practitioner and as supervising physician I was immediately available for consultation/collaboration.   Nelia Shi, MD 02/26/13 1226

## 2013-08-12 ENCOUNTER — Emergency Department (HOSPITAL_COMMUNITY)
Admission: EM | Admit: 2013-08-12 | Discharge: 2013-08-13 | Disposition: A | Discharge status: Home / Self Care | Payer: BC Managed Care – PPO | Attending: Orthopedic Surgery | Admitting: Orthopedic Surgery

## 2013-08-12 ENCOUNTER — Encounter (HOSPITAL_COMMUNITY): Payer: Self-pay | Admitting: Emergency Medicine

## 2013-08-12 ENCOUNTER — Emergency Department (HOSPITAL_COMMUNITY): Payer: BC Managed Care – PPO

## 2013-08-12 DIAGNOSIS — Y92410 Unspecified street and highway as the place of occurrence of the external cause: Secondary | ICD-10-CM | POA: Insufficient documentation

## 2013-08-12 DIAGNOSIS — S0990XA Unspecified injury of head, initial encounter: Secondary | ICD-10-CM | POA: Insufficient documentation

## 2013-08-12 DIAGNOSIS — E119 Type 2 diabetes mellitus without complications: Secondary | ICD-10-CM | POA: Insufficient documentation

## 2013-08-12 DIAGNOSIS — IMO0002 Reserved for concepts with insufficient information to code with codable children: Secondary | ICD-10-CM | POA: Insufficient documentation

## 2013-08-12 DIAGNOSIS — S0993XA Unspecified injury of face, initial encounter: Secondary | ICD-10-CM | POA: Insufficient documentation

## 2013-08-12 DIAGNOSIS — Z7982 Long term (current) use of aspirin: Secondary | ICD-10-CM | POA: Insufficient documentation

## 2013-08-12 DIAGNOSIS — I10 Essential (primary) hypertension: Secondary | ICD-10-CM | POA: Insufficient documentation

## 2013-08-12 DIAGNOSIS — Y9389 Activity, other specified: Secondary | ICD-10-CM | POA: Insufficient documentation

## 2013-08-12 DIAGNOSIS — Z79899 Other long term (current) drug therapy: Secondary | ICD-10-CM | POA: Insufficient documentation

## 2013-08-12 DIAGNOSIS — R209 Unspecified disturbances of skin sensation: Secondary | ICD-10-CM | POA: Insufficient documentation

## 2013-08-12 MED ORDER — ACETAMINOPHEN 325 MG PO TABS
650.0000 mg | ORAL_TABLET | Freq: Once | ORAL | Status: AC
  Administered 2013-08-12: 650 mg via ORAL
  Filled 2013-08-12: qty 2
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
## 2013-08-13 MED ORDER — IBUPROFEN 600 MG PO TABS: 600.0000 mg | ORAL_TABLET | Freq: Four times a day (QID) | ORAL | Status: DC | PRN

## 2013-08-13 MED ORDER — HYDROCODONE-ACETAMINOPHEN 5-325 MG PO TABS: 1.0000 | ORAL_TABLET | Freq: Four times a day (QID) | ORAL | Status: DC | PRN

## 2013-08-13 NOTE — ED Notes (Signed)
Soft cervical collar placed; pt instructed on use.

## 2013-08-15 ENCOUNTER — Emergency Department (HOSPITAL_COMMUNITY)
Admission: EM | Admit: 2013-08-15 | Discharge: 2013-08-16 | Disposition: A | Discharge status: Home / Self Care | Payer: BC Managed Care – PPO | Attending: Emergency Medicine | Admitting: Emergency Medicine

## 2013-08-15 ENCOUNTER — Encounter (HOSPITAL_COMMUNITY): Payer: Self-pay | Admitting: Emergency Medicine

## 2013-08-15 DIAGNOSIS — Z87891 Personal history of nicotine dependence: Secondary | ICD-10-CM | POA: Insufficient documentation

## 2013-08-15 DIAGNOSIS — E119 Type 2 diabetes mellitus without complications: Secondary | ICD-10-CM | POA: Insufficient documentation

## 2013-08-15 DIAGNOSIS — Z7982 Long term (current) use of aspirin: Secondary | ICD-10-CM | POA: Insufficient documentation

## 2013-08-15 DIAGNOSIS — J329 Chronic sinusitis, unspecified: Secondary | ICD-10-CM

## 2013-08-15 DIAGNOSIS — R51 Headache: Secondary | ICD-10-CM | POA: Insufficient documentation

## 2013-08-15 DIAGNOSIS — I10 Essential (primary) hypertension: Secondary | ICD-10-CM | POA: Insufficient documentation

## 2013-08-15 DIAGNOSIS — J019 Acute sinusitis, unspecified: Secondary | ICD-10-CM | POA: Insufficient documentation

## 2013-08-15 DIAGNOSIS — Z79899 Other long term (current) drug therapy: Secondary | ICD-10-CM | POA: Insufficient documentation

## 2013-08-15 NOTE — ED Notes (Signed)
Pt st's he took his Vicodin for back pain and went to sleep approx 8:30pm.  Pt st's pain behind left eye woke him up and has continued to have the pain. Pt st's he did not take anymore pain meds because it wasn't time.

## 2013-08-15 NOTE — ED Notes (Signed)
Pt. reports persistent low and upper back pain with headache onset this evening , seen here 08/12/2010 prescribed with Ibuprofen and Vicodin .

## 2013-08-16 ENCOUNTER — Emergency Department (HOSPITAL_COMMUNITY): Payer: BC Managed Care – PPO

## 2013-08-16 MED ORDER — CYCLOBENZAPRINE HCL 10 MG PO TABS: 10.0000 mg | ORAL_TABLET | Freq: Two times a day (BID) | ORAL | Status: DC | PRN

## 2013-08-16 MED ORDER — FLUTICASONE PROPIONATE 50 MCG/ACT NA SUSP: 2.0000 | Freq: Every day | NASAL | Status: DC
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
## 2013-08-16 NOTE — ED Notes (Signed)
Pt updated on plan of care.  Pt voices understanding.

## 2013-08-22 NOTE — ED Provider Notes (Signed)
Medical screening examination/treatment/procedure(s) were performed by non-physician practitioner and as supervising physician I was immediately available for consultation/collaboration.  Doug Sou, MD 08/22/13 [phone]

## 2014-06-29 ENCOUNTER — Emergency Department (HOSPITAL_COMMUNITY): Payer: BC Managed Care – PPO

## 2014-06-29 ENCOUNTER — Observation Stay (HOSPITAL_COMMUNITY)
Admission: EM | Admit: 2014-06-29 | Discharge: 2014-06-30 | Disposition: A | Discharge status: Home / Self Care | Payer: BC Managed Care – PPO | Attending: Family Medicine | Admitting: Family Medicine

## 2014-06-29 ENCOUNTER — Encounter (HOSPITAL_COMMUNITY): Payer: Self-pay | Admitting: Emergency Medicine

## 2014-06-29 DIAGNOSIS — J320 Chronic maxillary sinusitis: Secondary | ICD-10-CM

## 2014-06-29 DIAGNOSIS — G458 Other transient cerebral ischemic attacks and related syndromes: Secondary | ICD-10-CM

## 2014-06-29 DIAGNOSIS — J3489 Other specified disorders of nose and nasal sinuses: Secondary | ICD-10-CM | POA: Insufficient documentation

## 2014-06-29 DIAGNOSIS — E1159 Type 2 diabetes mellitus with other circulatory complications: Secondary | ICD-10-CM

## 2014-06-29 DIAGNOSIS — Z7982 Long term (current) use of aspirin: Secondary | ICD-10-CM | POA: Diagnosis not present

## 2014-06-29 DIAGNOSIS — R29898 Other symptoms and signs involving the musculoskeletal system: Secondary | ICD-10-CM | POA: Insufficient documentation

## 2014-06-29 DIAGNOSIS — I10 Essential (primary) hypertension: Secondary | ICD-10-CM | POA: Diagnosis not present

## 2014-06-29 DIAGNOSIS — E119 Type 2 diabetes mellitus without complications: Secondary | ICD-10-CM | POA: Insufficient documentation

## 2014-06-29 DIAGNOSIS — Z87891 Personal history of nicotine dependence: Secondary | ICD-10-CM | POA: Insufficient documentation

## 2014-06-29 DIAGNOSIS — G459 Transient cerebral ischemic attack, unspecified: Secondary | ICD-10-CM | POA: Diagnosis not present

## 2014-06-29 DIAGNOSIS — G451 Carotid artery syndrome (hemispheric): Secondary | ICD-10-CM

## 2014-06-29 DIAGNOSIS — Z79899 Other long term (current) drug therapy: Secondary | ICD-10-CM | POA: Insufficient documentation

## 2014-06-29 DIAGNOSIS — R42 Dizziness and giddiness: Secondary | ICD-10-CM | POA: Diagnosis present

## 2014-06-29 LAB — COMPREHENSIVE METABOLIC PANEL
ALT: 24 U/L (ref 0–53)
ANION GAP: 13 (ref 5–15)
AST: 24 U/L (ref 0–37)
Albumin: 4.6 g/dL (ref 3.5–5.2)
Alkaline Phosphatase: 80 U/L (ref 39–117)
BUN: 15 mg/dL (ref 6–23)
CALCIUM: 9.6 mg/dL (ref 8.4–10.5)
CO2: 28 meq/L (ref 19–32)
CREATININE: 0.85 mg/dL (ref 0.50–1.35)
Chloride: 98 mEq/L (ref 96–112)
GFR calc Af Amer: >90 mL/min (ref >90)
Glucose, Bld: 110 mg/dL — ABNORMAL HIGH (ref 70–99)
Potassium: 4.6 mEq/L (ref 3.7–5.3)
Sodium: 139 mEq/L (ref 137–147)
Total Bilirubin: 0.5 mg/dL (ref 0.3–1.2)
Total Protein: 8.2 g/dL (ref 6.0–8.3)

## 2014-06-29 LAB — URINALYSIS, ROUTINE W REFLEX MICROSCOPIC
Bilirubin Urine: NEGATIVE
GLUCOSE, UA: NEGATIVE mg/dL
Hgb urine dipstick: NEGATIVE
KETONES UR: NEGATIVE mg/dL
LEUKOCYTES UA: NEGATIVE
NITRITE: NEGATIVE
PROTEIN: NEGATIVE mg/dL
Specific Gravity, Urine: 1.022 (ref 1.005–1.030)
UROBILINOGEN UA: 0.2 mg/dL (ref 0.0–1.0)
pH: 5.5 (ref 5.0–8.0)

## 2014-06-29 LAB — GLUCOSE, CAPILLARY: Glucose-Capillary: 146 mg/dL — ABNORMAL HIGH (ref 70–99)

## 2014-06-29 LAB — CBC
HEMATOCRIT: 44 % (ref 39.0–52.0)
Hemoglobin: 14.9 g/dL (ref 13.0–17.0)
MCH: 30.3 pg (ref 26.0–34.0)
MCHC: 33.9 g/dL (ref 30.0–36.0)
MCV: 89.6 fL (ref 78.0–100.0)
PLATELETS: 266 10*3/uL (ref 150–400)
RBC: 4.91 MIL/uL (ref 4.22–5.81)
RDW: 14.4 % (ref 11.5–15.5)
WBC: 6.5 10*3/uL (ref 4.0–10.5)

## 2014-06-29 MED ORDER — INSULIN ASPART 100 UNIT/ML ~~LOC~~ SOLN: 0.0000 [IU] | Freq: Three times a day (TID) | SUBCUTANEOUS | Status: DC

## 2014-06-29 MED ORDER — HYDROCHLOROTHIAZIDE 25 MG PO TABS
25.0000 mg | ORAL_TABLET | Freq: Every day | ORAL | Status: DC
  Administered 2014-06-30: 25 mg via ORAL
  Filled 2014-06-29: qty 1

## 2014-06-29 MED ORDER — INSULIN ASPART 100 UNIT/ML ~~LOC~~ SOLN: 0.0000 [IU] | Freq: Every day | SUBCUTANEOUS | Status: DC

## 2014-06-29 MED ORDER — ACETAMINOPHEN 325 MG PO TABS: 650.0000 mg | ORAL_TABLET | ORAL | Status: DC | PRN

## 2014-06-29 MED ORDER — ASPIRIN EC 325 MG PO TBEC
325.0000 mg | DELAYED_RELEASE_TABLET | Freq: Every day | ORAL | Status: DC
  Administered 2014-06-29 – 2014-06-30 (×2): 325 mg via ORAL
  Filled 2014-06-29 (×2): qty 1

## 2014-06-29 MED ORDER — ATORVASTATIN CALCIUM 40 MG PO TABS
40.0000 mg | ORAL_TABLET | Freq: Every day | ORAL | Status: DC
  Administered 2014-06-30: 40 mg via ORAL
  Filled 2014-06-29: qty 1

## 2014-06-29 MED ORDER — QUINAPRIL HCL 10 MG PO TABS: 20.0000 mg | ORAL_TABLET | Freq: Every day | ORAL | Status: DC

## 2014-06-29 MED ORDER — LISINOPRIL 20 MG PO TABS
20.0000 mg | ORAL_TABLET | Freq: Every day | ORAL | Status: DC
  Administered 2014-06-30: 20 mg via ORAL
  Filled 2014-06-29: qty 1

## 2014-06-29 MED ORDER — HEPARIN SODIUM (PORCINE) 5000 UNIT/ML IJ SOLN
5000.0000 [IU] | Freq: Three times a day (TID) | INTRAMUSCULAR | Status: DC
  Administered 2014-06-30: 5000 [IU] via SUBCUTANEOUS
  Filled 2014-06-29: qty 1

## 2014-06-29 MED ORDER — STROKE: EARLY STAGES OF RECOVERY BOOK
Freq: Once | Status: AC
  Administered 2014-06-29: 1
  Filled 2014-06-29: qty 1

## 2014-06-29 NOTE — ED Notes (Signed)
Pt. Refuses iv until it is necessarily needed. Pt. Does verbally understand the risks and benefits of not having a piv. Will agree to one at time of necessity for dx. Tests.

## 2014-06-29 NOTE — Progress Notes (Signed)
Alan Ripperalled Edward RN for report at 2230. Received report and awaiting arrival of pt to unit.

## 2014-06-29 NOTE — Consult Note (Signed)
Neurology Consultation Reason for Consult: Transient left sided weakness Referring Physician: Alric RanGoldston, S  CC: Transient left sided weakness  History is obtained from: patient   HPI: Lonia ChimeraLevander Corrales is a 63 y.o. male who awoke this morning with a dizzy sensation that resolved after a few minutes. Later in teh day, he was walking and had sudden onset dizziness, but this time accompanied by left sided numbness and weakness. This lasted for a few minutes and then resolved on it's own.   Currently he feels back to baseline.   LKW: 7/22 prior to bed.  tpa given?: no, resolved symptoms.     ROS: A 14 point ROS was performed and is negative except as noted in the HPI.   Past Medical History  Diagnosis Date  . Hypertension   . Diabetes mellitus     Family History: No hx stroke  Social History: Tob: denies.   Exam: Current vital signs: BP 120/69  Pulse 72  Temp(Src) 98 F (36.7 C) (Oral)  Resp 17  Wt 97.07 kg (214 lb)  SpO2 97% Vital signs in last 24 hours: Temp:  [98 F (36.7 C)] 98 F (36.7 C) (07/23 1716) Pulse Rate:  [71-89] 72 (07/23 1930) Resp:  [13-20] 17 (07/23 1930) BP: (114-128)/(69-82) 120/69 mmHg (07/23 1930) SpO2:  [97 %-100 %] 97 % (07/23 1930) Weight:  [97.07 kg (214 lb)] 97.07 kg (214 lb) (07/23 1716)  General: in bed, NAD CV: RRR Mental Status: Patient is awake, alert, oriented to person, place, month, year, and situation. Immediate and remote memory are intact. Patient is able to give a clear and coherent history. No signs of aphasia or neglect Cranial Nerves: II: Visual Fields are full. Pupils are equal, round, and reactive to light.  Discs are difficult to visualize. III,IV, VI: EOMI without ptosis or diploplia.  V: Facial sensation is symmetric to temperature VII: Facial movement is symmetric.  VIII: hearing is intact to voice X: Uvula elevates symmetrically XI: Shoulder shrug is symmetric. XII: tongue is midline without atrophy or  fasciculations.  Motor: Tone is normal. Bulk is normal. 5/5 strength was present in all four extremities.  Sensory: Sensation is symmetric to light touch and temperature in the arms and legs. Deep Tendon Reflexes: 2+ and symmetric in the biceps and patellae.  Plantars: Toes are downgoing bilaterally.  Cerebellar: FNF and HKS are intact bilaterally Gait: Not assessed due to multiple medical monitors in ED setting.    I have reviewed labs in epic and the results pertinent to this consultation are: Bmp, cbc unremarkable  I have reviewed the images obtained:CT head - negative  Impression: 63 yo M with a history of transient left sided weakness and several episodes of "dizziness". I am concerned for TIA and would favor admission for workup.   Recommendations: 1. HgbA1c, fasting lipid panel 2. MRI, MRA  of the brain without contrast 3. Frequent neuro checks 4. Echocardiogram 5. Carotid dopplers 6. Prophylactic therapy-Antiplatelet med: Aspirin - dose 325mg  PO or 300mg  PR 7. Risk factor modification 8. Telemetry monitoring 9. PT consult, OT consult, Speech consult    Ritta SlotMcNeill Jackee Glasner, MD [facility] [phone]  If 7pm- 7am, please page neurology on call as listed in AMION.

## 2014-06-29 NOTE — ED Provider Notes (Signed)
CSN: [id number]     Arrival date & time 06/29/14  1703 History   First MD Initiated Contact with Patient 06/29/14 1821     Chief Complaint  Patient presents with  . Stroke Symptoms     (Consider location/radiation/quality/duration/timing/severity/associated sxs/prior Treatment) HPI 63 year old male with about 10 minutes of left arm and leg weakness while at work. This morning he felt lightheaded and not quite right, but no weakness. Noticed he had a hard time moving his leg at work that spontaneously resolved. Now completely asymptomatic. No headache. No prior history of strokes.   Past Medical History  Diagnosis Date  . Hypertension   . Diabetes mellitus    History reviewed. No pertinent past surgical history. Family History  Problem Relation Age of Onset  . Hypertension Mother   . Diabetes Mother    History  Substance Use Topics  . Smoking status: Former Games developer  . Smokeless tobacco: Not on file  . Alcohol Use: Yes    Review of Systems  Respiratory: Negative for shortness of breath.   Cardiovascular: Negative for chest pain.  Gastrointestinal: Negative for vomiting and abdominal pain.  Genitourinary: Negative for dysuria.  Neurological: Positive for weakness and light-headedness. Negative for headaches.  All other systems reviewed and are negative.     Allergies  Review of patient's allergies indicates no known allergies.  Home Medications   Prior to Admission medications   Medication Sig Start Date End Date Taking? Authorizing Provider  aspirin EC 81 MG tablet Take 81 mg by mouth daily.    Historical Provider, MD  cyclobenzaprine (FLEXERIL) 10 MG tablet Take 1 tablet (10 mg total) by mouth 2 (two) times daily as needed for muscle spasms. 08/16/13   Sunnie Nielsen, MD  fluticasone (FLONASE) 50 MCG/ACT nasal spray Place 2 sprays into the nose daily. 08/16/13   Sunnie Nielsen, MD  glipiZIDE (GLUCOTROL) 5 MG tablet Take 5 mg by mouth 2 (two) times daily before a meal. 11/22/12    Maretta Bees, MD  hydrochlorothiazide (HYDRODIURIL) 25 MG tablet Take 12.5 mg by mouth daily.  11/17/12   Hayden Rasmussen, NP  HYDROcodone-acetaminophen (NORCO/VICODIN) 5-325 MG per tablet Take 1 tablet by mouth every 6 (six) hours as needed for pain. 08/13/13   Jimmye Norman, NP  ibuprofen (ADVIL,MOTRIN) 600 MG tablet Take 1 tablet (600 mg total) by mouth every 6 (six) hours as needed for pain. 08/13/13   Jimmye Norman, NP  metFORMIN (GLUCOPHAGE) 1000 MG tablet Take 1,000 mg by mouth 2 (two) times daily with a meal.    Historical Provider, MD  quinapril (ACCUPRIL) 20 MG tablet Take 20 mg by mouth daily. 11/17/12   Hayden Rasmussen, NP   BP 120/69  Pulse 72  Temp(Src) 98 F (36.7 C) (Oral)  Resp 17  Wt 214 lb (97.07 kg)  SpO2 97% Physical Exam  Nursing note and vitals reviewed. Constitutional: He is oriented to person, place, and time. He appears well-developed and well-nourished. No distress.  HENT:  Head: Normocephalic and atraumatic.  Right Ear: External ear normal.  Left Ear: External ear normal.  Nose: Nose normal.  Eyes: EOM are normal. Pupils are equal, round, and reactive to light. Right eye exhibits no discharge. Left eye exhibits no discharge.  Neck: Neck supple.  Cardiovascular: Normal rate, regular rhythm, normal heart sounds and intact distal pulses.   Pulmonary/Chest: Effort normal and breath sounds normal.  Abdominal: Soft. He exhibits no distension. There is no tenderness.  Musculoskeletal: He  exhibits no edema.  Neurological: He is alert and oriented to person, place, and time.  CN 2-12 grossly intact. 5/5 strength in all 4 extremities.   Skin: Skin is warm and dry.    ED Course  Procedures (including critical care time) Labs Review Labs Reviewed  COMPREHENSIVE METABOLIC PANEL - Abnormal; Notable for the following:    Glucose, Bld 110 (*)    All other components within normal limits  URINALYSIS, ROUTINE W REFLEX MICROSCOPIC - Abnormal; Notable for the following:     APPearance CLOUDY (*)    All other components within normal limits  GLUCOSE, CAPILLARY - Abnormal; Notable for the following:    Glucose-Capillary 146 (*)    All other components within normal limits  CBC  URINE RAPID DRUG SCREEN (HOSP PERFORMED)  HEMOGLOBIN A1C  LIPID PANEL    Imaging Review Ct Head Wo Contrast  06/29/2014   CLINICAL DATA:  left sided weakness  EXAM: CT HEAD WITHOUT CONTRAST  TECHNIQUE: Contiguous axial images were obtained from the base of the skull through the vertex without intravenous contrast.  COMPARISON:  Prior CT from 08/16/2013  FINDINGS: There is no acute intracranial hemorrhage or infarct. No mass lesion or midline shift. Gray-white matter differentiation is well maintained. Ventricles are normal in size without evidence of hydrocephalus. CSF containing spaces are within normal limits. No extra-axial fluid collection.  The calvarium is intact.  Orbital soft tissues are within normal limits.  Chronic right frontoethmoidal and maxillary sinus disease is present, similar to prior.  Scalp soft tissues are unremarkable.  IMPRESSION: 1. No acute intracranial process. 2. Chronic right-sided paranasal sinus disease.   Electronically Signed   By: Rise MuBenjamin  McClintock M.D.   On: 06/29/2014 21:05     EKG Interpretation None      MDM   Final diagnoses:  Other specified transient cerebral ischemias  Type 2 diabetes mellitus without complication  Essential hypertension    Symptoms concerning for a TIA. Given his risk factors (age, DM, HTN), will need admission for TIA w/u.     Audree CamelScott T Carliss Porcaro, MD 06/30/14 [phone]

## 2014-06-29 NOTE — ED Notes (Signed)
Admitting MD at bedside.

## 2014-06-29 NOTE — ED Notes (Signed)
Pt waiting on MD

## 2014-06-29 NOTE — ED Notes (Signed)
Pt in stating that he has noticed some weakness to his left arm and leg over the last few day, also feeling fatigue, denies pain, no deficits noted in triage

## 2014-06-29 NOTE — H&P (Signed)
Family Medicine Teaching [facility] Admission History and Physical Service Pager: [phone]  Patient name: Andre Reed Medical record number: [id number] Date of birth: May 14, 1951 Age: 63 y.o. Gender: male  Primary Care Provider: Pcp Not In System (?? Dr. Remer Macho") Consultants: Neurology  Code Status: Full  Chief Complaint: Dizziness, left-sided leg and arm weakness  Assessment and Plan: Andre Reed is a 63 y.o. male presenting with dizziness, left-sided arm and leg weakness which is resolved upon admission. PMH is significant for hypertension, diabetes type 2.  # Suspect TIA -- vs stroke vs vertigo vs dehydration; patient with persistent dizziness, left-sided arm and leg weakness. Stroke unlikely however b/c Sxs have resolved, no one was around patient while sxs present to observe possible facial droop--will continue full w/u. Vertigo (BPV) unlikely due to reported left-side weakness. Dehydration possible cause due to reports of working outside earlier in day, BUN:Cr ratio currently 17.6 (no prerenal sxs) - admitted from ED to Telemetry - Neurology consulted in ED- appreciate recommendations  - UA: normal (except cloudiness) - CBC wnl - CMP wnl - Continue Lipitor 40mg  QD - W/u for TIA/Stroke:   - Carotid doppler (ordered)  - Echocardiogram (ordered)   - MRI Brain (ordered)  - CT Brain -- showed no acute intracranial process; Rt-sided paranasal sinus dz  - Lipid panel  - UDS  # Type 2 Diabetes - Current blood glucose - 146 - Previous A1C 11.8 in 12/23 and recently 12 per pt - DC'd home meds while admitted (metformin/glipizide) - Placed on SSI (sensitive) - A1c ordered  # HTN - Hypertensive on admittion: 158/89 - Continue home medication regimen - HCTZ 25mg  QD - Lisinopril 20mg  QD   FEN/GI: Carb modified diet Prophylaxis: SQ Heparin  Disposition: Admitted to telemetry; Home when deemed medically stable  History of Present Illness: Andre Reed is a 63  y.o. male presenting with Dizziness, Lt sided arm/leg weakness and tingling. Patient states that he woke up this morning with intermittent dizziness. His dizziness persisted throughout the majority of this morning. Around 1 PM he noted market weakness in his left arm and leg, with some numbness and  tingling. Patient states that at that time he informed his wife and he came to the ED. In the emergency department his symptoms had completely resolved.  Patient been seen by neurology (Dr. Amada Jupiter).  Patient is a previous smoker (quit in 1976). He reports relatively well-controlled glucose control (126-140), and being compliant with medication. Patient states that he has no diminished appetite, no pain, fever, shortness of breath, chest pain, nausea/vomiting/diarrhea, headache.  Patient currently refusing IV placement. He states he does not want to sleep with it in. Patient understands that IV placement may be required during his stay, and he states he is willing to have one placed at that time.   Review Of Systems: Per HPI Otherwise 12 point review of systems was performed and was unremarkable.  Patient Active Problem List   Diagnosis Date Noted  . TIA (transient ischemic attack) 06/29/2014  . Sinusitis 11/22/2012  . HTN (hypertension) 11/22/2012  . DM (diabetes mellitus) 11/22/2012   Past Medical History: Past Medical History  Diagnosis Date  . Hypertension   . Diabetes mellitus    Past Surgical History: History reviewed. No pertinent past surgical history. Social History: History  Substance Use Topics  . Smoking status: Former Games developer  . Smokeless tobacco: Not on file  . Alcohol Use: Yes   Additional social history: none Please also refer to relevant sections of  EMR.  Family History: Family History  Problem Relation Age of Onset  . Hypertension Mother   . Diabetes Mother    Allergies and Medications: No Known Allergies No current facility-administered medications on  file prior to encounter.   Current Outpatient Prescriptions on File Prior to Encounter  Medication Sig Dispense Refill  . aspirin EC 81 MG tablet Take 81 mg by mouth daily.      Marland Kitchen. glipiZIDE (GLUCOTROL) 5 MG tablet Take 2.5 mg by mouth 2 (two) times daily before a meal.       . hydrochlorothiazide (HYDRODIURIL) 25 MG tablet Take 25 mg by mouth daily.       . metFORMIN (GLUCOPHAGE) 1000 MG tablet Take 500 mg by mouth 2 (two) times daily with a meal.       . quinapril (ACCUPRIL) 20 MG tablet Take 20 mg by mouth daily.        Objective: BP 136/91  Pulse 67  Temp(Src) 98 F (36.7 C) (Oral)  Resp 18  Wt 214 lb (97.07 kg)  SpO2 98% Exam: General -- oriented x3, NAD, pleasant and cooperative. HEENT -- Head is normocephalic. Eyes, pupils equal and round and react to light and accommodation. Extraocular motions are intact. Ears, nose and throat were benign. (see neuro) Neck -- supple; no bruits. Integument -- intact. No rash, erythema, or ecchymoses.  Chest -- good expansion. Lungs clear to auscultation. Cardiac -- RRR. No murmurs noted.  Abdomen -- soft, nontender. No masses palpable. Normal bowel sounds present. CNS -- cranial nerves II through XI grossly intact. Tongue Deviation noted (toward Lt), suspicion of hypoglossal (XII) nerve weakness/palsy Musculoskeletal - no tenderness or effusions noted. ROM good. 5/5 bilateral strength. Dorsalis pedis pulses present and symmetrical.  Skin: L shoulder with keloid scar    Labs and Imaging: CBC BMET   Recent Labs Lab 06/29/14 1719  WBC 6.5  HGB 14.9  HCT 44.0  PLT 266    Recent Labs Lab 06/29/14 1719  NA 139  K 4.6  CL 98  CO2 28  BUN 15  CREATININE 0.85  GLUCOSE 110*  CALCIUM 9.6     UA: cloudy, negative nitrites and leukocytes, Spec grav 1.022  CT head 06/29/2014 IMPRESSION:  1. No acute intracranial process.  2. Chronic right-sided paranasal sinus disease.   Kathee DeltonIan D McKeag, MD 06/29/2014, 10:26 PM PGY-1, [facility] FPTS Intern pager: [phone], text pages welcome   I have seen and examined the patient with Dr. Wende MottMcKeag and agree with his documentation above, my annotations are in blue.   Murtis SinkSam Bradshaw, MD [facility] Resident, PGY-3 06/29/2014, 11:51 PM

## 2014-06-30 ENCOUNTER — Observation Stay (HOSPITAL_COMMUNITY): Payer: BC Managed Care – PPO

## 2014-06-30 DIAGNOSIS — J320 Chronic maxillary sinusitis: Secondary | ICD-10-CM | POA: Diagnosis not present

## 2014-06-30 DIAGNOSIS — I10 Essential (primary) hypertension: Secondary | ICD-10-CM | POA: Diagnosis not present

## 2014-06-30 DIAGNOSIS — E1159 Type 2 diabetes mellitus with other circulatory complications: Secondary | ICD-10-CM

## 2014-06-30 DIAGNOSIS — G459 Transient cerebral ischemic attack, unspecified: Secondary | ICD-10-CM

## 2014-06-30 DIAGNOSIS — E119 Type 2 diabetes mellitus without complications: Secondary | ICD-10-CM

## 2014-06-30 DIAGNOSIS — G458 Other transient cerebral ischemic attacks and related syndromes: Secondary | ICD-10-CM | POA: Diagnosis not present

## 2014-06-30 DIAGNOSIS — I519 Heart disease, unspecified: Secondary | ICD-10-CM

## 2014-06-30 LAB — LIPID PANEL
CHOL/HDL RATIO: 3.4 ratio
CHOLESTEROL: 145 mg/dL (ref 0–200)
HDL: 43 mg/dL (ref >39)
LDL Cholesterol: 81 mg/dL (ref 0–99)
Triglycerides: 106 mg/dL (ref <150)
VLDL: 21 mg/dL (ref 0–40)

## 2014-06-30 LAB — GLUCOSE, CAPILLARY
Glucose-Capillary: 139 mg/dL — ABNORMAL HIGH (ref 70–99)
Glucose-Capillary: 292 mg/dL — ABNORMAL HIGH (ref 70–99)

## 2014-06-30 LAB — RAPID URINE DRUG SCREEN, HOSP PERFORMED
AMPHETAMINES: NOT DETECTED
BARBITURATES: NOT DETECTED
Benzodiazepines: NOT DETECTED
COCAINE: NOT DETECTED
OPIATES: NOT DETECTED
Tetrahydrocannabinol: NOT DETECTED

## 2014-06-30 LAB — HEMOGLOBIN A1C
Hgb A1c MFr Bld: 8.4 % — ABNORMAL HIGH (ref <5.7)
MEAN PLASMA GLUCOSE: 194 mg/dL — AB (ref <117)

## 2014-06-30 NOTE — Progress Notes (Signed)
OT Discharge Note  Patient Details Name: Lonia ChimeraLevander Simek MRN: [id number] DOB: August 25, 1951   Cancelled Treatment:    Reason Eval/Treat Not Completed: OT screened, no needs identified, will sign off PT [place] spoke with OT and no needs identified at this time .   Harolyn RutherfordJones, Davette Nugent B Pager: [phone]  06/30/2014, 2:27 PM

## 2014-06-30 NOTE — Progress Notes (Signed)
Attending MD called for status change; ok to change status to Observation per Dr Wende MottMcKeag; Abelino DerrickB Atwood Adcock RN,BSN,MHA [phone]

## 2014-06-30 NOTE — Progress Notes (Signed)
Stroke Team Progress Note  HISTORY Andre Reed is a 63 y.o. male who awoke this morning with a dizzy sensation that resolved after a few minutes. Later in teh day, he was walking and had sudden onset dizziness, but this time accompanied by left arm and leg numbness and weakness. This lasted for a few minutes and then resolved on it's own.  Currently he feels back to baseline.   LKW: 7/22 prior to bed.   tpa given?: no, resolved symptoms.   SUBJECTIVE There no family members present. The patient feels he is back to baseline.   OBJECTIVE Most recent Vital Signs: Filed Vitals:   06/30/14 0144 06/30/14 0628 06/30/14 0956 06/30/14 1332  BP: 143/78 144/80 145/88 145/87  Pulse: 66 54 67 75  Temp: 97.6 F (36.4 C) 98.3 F (36.8 C) 98.1 F (36.7 C) 98.3 F (36.8 C)  TempSrc: Oral Oral Oral Oral  Resp: 16 16 20 20   Height:      Weight:      SpO2: 100% 99% 100% 100%   CBG (last 3)   Recent Labs  06/29/14 2332 06/30/14 0629 06/30/14 1134  GLUCAP 146* 139* 292*    IV Fluid Intake:     MEDICATIONS  . aspirin EC  325 mg Oral Daily  . atorvastatin  40 mg Oral Daily  . heparin  5,000 Units Subcutaneous 3 times per day  . hydrochlorothiazide  25 mg Oral Daily  . insulin aspart  0-5 Units Subcutaneous QHS  . insulin aspart  0-9 Units Subcutaneous TID WC  . lisinopril  20 mg Oral Daily   PRN:  acetaminophen  Diet:  Carb Control thin liquids Activity: Up with assistance DVT Prophylaxis:  Subcutaneous heparin  CLINICALLY SIGNIFICANT STUDIES Basic Metabolic Panel:  Recent Labs Lab 06/29/14 1719  NA 139  K 4.6  CL 98  CO2 28  GLUCOSE 110*  BUN 15  CREATININE 0.85  CALCIUM 9.6   Liver Function Tests:  Recent Labs Lab 06/29/14 1719  AST 24  ALT 24  ALKPHOS 80  BILITOT 0.5  PROT 8.2  ALBUMIN 4.6   CBC:  Recent Labs Lab 06/29/14 1719  WBC 6.5  HGB 14.9  HCT 44.0  MCV 89.6  PLT 266   Coagulation: No results found for this basename: LABPROT, INR,  in the  last 168 hours Cardiac Enzymes: No results found for this basename: CKTOTAL, CKMB, CKMBINDEX, TROPONINI,  in the last 168 hours Urinalysis:  Recent Labs Lab 06/29/14 2017  COLORURINE YELLOW  LABSPEC 1.022  PHURINE 5.5  GLUCOSEU NEGATIVE  HGBUR NEGATIVE  BILIRUBINUR NEGATIVE  KETONESUR NEGATIVE  PROTEINUR NEGATIVE  UROBILINOGEN 0.2  NITRITE NEGATIVE  LEUKOCYTESUR NEGATIVE   Lipid Panel    Component Value Date/Time   CHOL 145 06/30/2014 0038   TRIG 106 06/30/2014 0038   HDL 43 06/30/2014 0038   CHOLHDL 3.4 06/30/2014 0038   VLDL 21 06/30/2014 0038   LDLCALC 81 06/30/2014 0038   HgbA1C  Lab Results  Component Value Date   HGBA1C 8.4* 06/30/2014    Urine Drug Screen:     Component Value Date/Time   LABOPIA NONE DETECTED 06/30/2014 0003   COCAINSCRNUR NONE DETECTED 06/30/2014 0003   LABBENZ NONE DETECTED 06/30/2014 0003   AMPHETMU NONE DETECTED 06/30/2014 0003   THCU NONE DETECTED 06/30/2014 0003   LABBARB NONE DETECTED 06/30/2014 0003    Alcohol Level: No results found for this basename: ETH,  in the last 168 hours   Ct Head Wo  Contrast 06/29/2014    1. No acute intracranial process. 2. Chronic right-sided paranasal sinus disease.      Mri Brain Without Contrast 06/30/2014    Normal appearance of the brain itself at this time.  Right-sided paranasal sinusitis.     Mr Andre GlennMra Head/brain Wo Cm 06/30/2014    Pronounced narrowing and irregularity of the right PCA branch vessels and the left pericallosal artery branch of the anterior cerebral artery. The patient would be at considerable risk of infarction in those vascular territories.     Carotid Doppler  Preliminary report: Bilateral: 1-39% ICA stenosis lowest end of scale. Vertebral artery flow is antegrade.   2D Echocardiogram  ejection fraction of 45-50%. With diffuse hypokinesis. No obvious cardiac source of emboli identified.  EKG  For complete results please see formal report.   Therapy Recommendations no followup therapy  was recommended  Physical Exam   Mental Status:  Patient is awake, alert, oriented to person, place, month, year, and situation.  Immediate and remote memory are intact.  Patient is able to give a clear and coherent history.  No signs of aphasia or neglect  Cranial Nerves:  II: Visual Fields are full. Pupils are equal, round, and reactive to light. Discs are difficult to visualize.  III,IV, VI: EOMI without ptosis or diploplia.  V: Facial sensation is symmetric to temperature  VII: Facial movement is symmetric.  VIII: hearing is intact to voice  X: Uvula elevates symmetrically  XI: Shoulder shrug is symmetric.  XII: tongue is midline without atrophy or fasciculations.  Motor:  Tone is normal. Bulk is normal. 5/5 strength was present in all four extremities.  Sensory:  Sensation is symmetric to light touch and temperature in the arms and legs.  Deep Tendon Reflexes:  2+ and symmetric in the biceps and patellae.  Plantars:  Toes are downgoing bilaterally.  Cerebellar:  FNF and HKS are intact bilaterally  Gait:  Not assessed   ASSESSMENT Mr. Andre Reed is a 63 y.o. male presenting with transient left hemiparesis and left hemisensory deficits. TPA therapy was not initiated secondary to early resolution of deficits. Head CT and MRI negative for acute infarct. On aspirin 81 mg orally every day prior to admission. Now on aspirin 325 mg orally every day for secondary stroke prevention. Patient with resultant resolution of deficits. Stroke work up underway.   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  Diabetes mellitus - hemoglobin A1c 8.4  Hyperlipidemia history - cholesterol 145; LDL 81 - on Lipitor  MRA - Pronounced narrowing and irregularity of the right PCA branch vessels and the left pericallosal artery branch of the anterior cerebral artery. The patient would be at considerable risk of infarction in those vascular territories.    Hypertension history   Hospital day # 1  TREATMENT/PLAN  Continue aspirin 325 mg orally every day for secondary stroke prevention.   Continue statin for stroke prevention  DM not in good control, will need to follow up with PCP within a week  Stroke risk factor modification  Followup Dr. Roda ShuttersXu in 2 months.  Delton Seeavid Rinehuls PA-C [facility] Pager [phone] 06/30/2014, 4:57 PM   SIGNED I, the attending vascular neurologist, have personally obtained a history, examined the patient, evaluated laboratory data, individually viewed imaging studies, and formulated the assessment and plan of care.  I have made any additions or clarifications directly to the above note and agree with the findings and plan as currently documented.  Marvel Plan, MD PhD 06/30/2014 6:54 PM   To contact Stroke Continuity provider, please refer to [email or web address]. After hours, contact General Neurology

## 2014-06-30 NOTE — Evaluation (Addendum)
Physical Therapy Evaluation and Discharge  Patient Details Name: Andre ChimeraLevander Reed MRN: [id number] DOB: [birth year] Today's Date: 06/30/2014   History of Present Illness   63 y.o. male presenting with dizziness, left-sided arm and leg weakness which is resolved upon admission. PMH is significant for hypertension, diabetes type 2. Per MD noted Suspect TIA -- vs stroke vs vertigo vs dehydration.  Clinical Impression  Pt adm due to above. Pt at baseline from mobility standpoint. Challenged with high level balance activities and no balance deficits noted. Also no focal weakness noted at this time. Pt educated and given handout regarding stroke signs and symptoms. Pt very appreciative and verbalized understanding of importance of early detection. Will sign off at this time.     Follow Up Recommendations No PT follow up    Equipment Recommendations  None recommended by PT    Recommendations for Other Services       Precautions / Restrictions Precautions Precautions: None Restrictions Weight Bearing Restrictions: No      Mobility  Bed Mobility               General bed mobility comments: not addressed; pt standing up arrival and returned to recliner   Transfers Overall transfer level: Independent Equipment used: None             General transfer comment: no LOB or sway  Ambulation/Gait Ambulation/Gait assistance: Independent Ambulation Distance (Feet): 400 Feet Assistive device: None Gait Pattern/deviations: WFL(Within Functional Limits) Gait velocity: WFL Gait velocity interpretation: at or above normal speed for age/gender General Gait Details: pt demo good safety awareness and balance with ambulation and able to perform high level balance activities   Stairs Stairs: Yes Stairs assistance: Independent Stair Management: No rails;Alternating pattern;Forwards Number of Stairs: 15 General stair comments: demo good safety awareness and balance; ambulating at baseline    Wheelchair Mobility    Modified Rankin (Stroke Patients Only) Modified Rankin (Stroke Patients Only) Pre-Morbid Rankin Score: No symptoms Modified Rankin: No symptoms     Balance Overall balance assessment: Independent                           High level balance activites: Head turns;Sudden stops;Direction changes;Backward walking;Turns High Level Balance Comments: also able to pick objects up off ground. no LOB or balance deficits noted              Pertinent Vitals/Pain Denies any pain     Home Living Family/patient expects to be discharged to:: Private residence Living Arrangements: Spouse/significant other Available Help at Discharge: Family;Available 24 hours/day Type of Home: House Home Access: Level entry     Home Layout: Two level;Bed/bath upstairs Home Equipment: None      Prior Function Level of Independence: Independent         Comments: pt works at 2 places and rebuilds cars      Higher education careers adviserHand Dominance   Dominant Hand: Right    Extremity/Trunk Assessment   Upper Extremity Assessment: Overall WFL for tasks assessed           Lower Extremity Assessment: Overall WFL for tasks assessed      Cervical / Trunk Assessment: Normal  Communication   Communication: No difficulties  Cognition Arousal/Alertness: Awake/alert Behavior During Therapy: WFL for tasks assessed/performed Overall Cognitive Status: Within Functional Limits for tasks assessed                      General Comments General  comments (skin integrity, edema, etc.): given stroke signs and symptoms handout and educated on importance of early detection  and risk factors    Exercises        Assessment/Plan    PT Assessment Patent does not need any further PT services  PT Diagnosis     PT Problem List    PT Treatment Interventions     PT Goals (Current goals can be found in the Care Plan section) Acute Rehab PT Goals Patient Stated Goal: home today PT  Goal Formulation: No goals set, d/c therapy    Frequency     Barriers to discharge        Co-evaluation               End of Session   Activity Tolerance: Patient tolerated treatment well Patient left: in chair;with call bell/phone within reach;with family/visitor present Nurse Communication: Mobility status    Functional Assessment Tool Used: clinical education  Functional Limitation: Mobility: Walking and moving around Mobility: Walking and Moving Around Current Status [phone]): 0 percent impaired, limited or restricted Mobility: Walking and Moving Around Goal Status [phone]): 0 percent impaired, limited or restricted Mobility: Walking and Moving Around Discharge Status [phone]): 0 percent impaired, limited or restricted    Time: 1355-1406 PT Time Calculation (min): 11 min   Charges:   PT Evaluation $Initial PT Evaluation Tier I: 1 Procedure PT Treatments $Gait Training: 8-22 mins   PT G Codes:   Functional Assessment Tool Used: clinical education  Functional Limitation: Mobility: Walking and moving around    Roberts, [place], [place]  [phone] 06/30/2014, 2:47 PM

## 2014-06-30 NOTE — Progress Notes (Signed)
  Echocardiogram 2D Echocardiogram has been performed.  Andre Reed 06/30/2014, 9:18 AM

## 2014-06-30 NOTE — Progress Notes (Signed)
Family Medicine Teaching Service Daily Progress Note Intern Pager: [phone]  Patient name: Andre Reed Medical record number: [id number] Date of birth: 26-Jun-1951 Age: 63 y.o. Gender: male  Primary Care Provider: Pcp Not In System Consultants: Neurology Code Status: Full  Pt Overview and Major Events to Date:  7/23: Pt admitted from ED for TIA w/u  Assessment and Plan:  # Suspect TIA -- vs stroke vs vertigo vs dehydration; patient with persistent dizziness, left-sided arm and leg weakness. Stroke unlikely however b/c Sxs have resolved, no one was around patient while sxs present to observe possible facial droop--will continue full w/u. Vertigo (BPV) unlikely due to reported left-side weakness. Dehydration possible cause due to reports of working outside earlier in day, BUN:Cr ratio currently 17.6 (no prerenal sxs)  - Neurology consulted in ED- appreciate recommendations ; ASA 325mg  PO QD - UA: normal (except cloudiness)  - CBC wnl  - CMP wnl  - Continue Lipitor 40mg  QD   - Carotid doppler (pending)  - Echocardiogram (pending)  - MRI Brain (see "imaging" section at bottom)  - CT Brain -- showed no acute intracranial process; Rt-sided paranasal sinus dz  - Lipid panel - (wnl) - UDS (neg)  # Type 2 Diabetes  - Current blood glucose - (7/23)146  - Previous A1C 11.8 in 12/23 and recently 12 per pt  - DC'd home meds while admitted (metformin/glipizide)  - Placed on SSI (sensitive)  - A1c ordered (pending)  # HTN  - Hypertensive on admittion: 158/89  - Continue home medication regimen  - HCTZ 25mg  QD  - Lisinopril 20mg  QD   FEN/GI: Carb Modified Diet PPx: SQ heparin  Disposition: home when deemed medically stable  Subjective:  Patient states he feels great this morning. I was recently back from the MRI. He says he is ready to go home today. I informed him he may still have one more night to stay in the hospital but I would discuss his DC w/ my care team. No numbness,  weakness, dysphagia, or tongue deviation noted today.  Objective: Temp:  [97.6 F (36.4 C)-98.3 F (36.8 C)] 97.6 F (36.4 C) (07/24 0144) Pulse Rate:  [58-89] 66 (07/24 0144) Resp:  [12-21] 16 (07/24 0144) BP: (114-163)/(67-95) 143/78 mmHg (07/24 0144) SpO2:  [97 %-100 %] 100 % (07/24 0144) Weight:  [205 lb 14.6 oz (93.4 kg)-214 lb (97.07 kg)] 205 lb 14.6 oz (93.4 kg) (07/23 2314) Physical Exam: General -- oriented x3, NAD, pleasant and cooperative.  HEENT -- Head is normocephalic. Eyes, pupils equal and round and react to light and accommodation. Extraocular motions are intact. Ears, nose and throat were benign. Neck -- supple; no bruits.  Integument -- intact. No rash, erythema, or ecchymoses.  Chest -- good expansion. Lungs clear to auscultation.  Cardiac -- RRR. No murmurs noted.  Abdomen -- soft, nontender. No masses palpable. Normal bowel sounds present.  CNS -- cranial nerves II through XI grossly intact. Tongue Deviation NO Longer noted   Musculoskeletal - no tenderness or effusions noted. ROM good. 5/5 bilateral strength. Dorsalis pedis pulses present and symmetrical.   Laboratory:  Recent Labs Lab 06/29/14 1719  WBC 6.5  HGB 14.9  HCT 44.0  PLT 266    Recent Labs Lab 06/29/14 1719  NA 139  K 4.6  CL 98  CO2 28  BUN 15  CREATININE 0.85  CALCIUM 9.6  PROT 8.2  BILITOT 0.5  ALKPHOS 80  ALT 24  AST 24  GLUCOSE 110*   UDS -  neg Lipid Panel - wnl A1c - pending   Imaging/Diagnostic Tests: CT Head (7/23) IMPRESSION:  1. No acute intracranial process.  2. Chronic right-sided paranasal sinus disease.  MRI Brain (7/24) IMPRESSION:  Normal appearance of the brain itself at this time.  Right-sided paranasal sinusitis.  Pronounced narrowing and irregularity of the right PCA branch  vessels and the left pericallosal artery branch of the anterior  cerebral artery. The patient would be at considerable risk of  infarction in those vascular  territories.  Echocardiogram (Pending)  Carotid Doppler (Pending)   Kathee DeltonIan D McKeag, MD 06/30/2014, 6:26 AM PGY-1, [facility] FPTS Intern pager: [phone], text pages welcome

## 2014-06-30 NOTE — Progress Notes (Signed)
I have seen and examined this patient. I have discussed with Dr Ermalinda MemosBradshaw.  I agree with their findings and plans as documented in their admission note.  Acute Issues 1. TIA - ABCD2 Risk score for CVA in next 7 days = 5 (4% risk) - Brain MRI without evidence of infarction - Secondary Stroke prevention - High Intensity statin: increase atorvastin 40mg  to 80 mg daily - Daily Aspirin; 325 daily for 14 days then 81 mg daily indefinitely - Goal BP in diabetes < 140/90.  Patient may need additional antihypertensive therapy and DASH diet.  - Follow up Echo results as outpatient.  - Goal A1c < 8.0 - patient may need insulin to achieve this goal. - Follow up inhouse PT recommendations.

## 2014-06-30 NOTE — Progress Notes (Signed)
VASCULAR LAB PRELIMINARY  PRELIMINARY  PRELIMINARY  PRELIMINARY  Carotid duplex completed.    Preliminary report:  Bilateral:  1-39% ICA stenosis lowest end of scale.  Vertebral artery flow is antegrade.     Tanise Russman, RVS 06/30/2014, 8:55 AM

## 2014-07-01 NOTE — Discharge Summary (Signed)
Family Medicine Teaching [facility] Discharge Summary  Patient name: Andre Reed Medical record number: [id number] Date of birth: [birth year] Age: 63 y.o. Gender: male Date of Admission: 06/29/2014  Date of Discharge: 06/30/14 Admitting Physician: Tobey Grim, MD  Primary Care Provider: Pcp Not In System Consultants: Neurology  Indication for Hospitalization: Dizziness and Lt-sided weakness  Discharge Diagnoses/Problem List:  TIA DM type 2 HTN  Disposition: Home  Discharge Condition: Medically Stable  Discharge Exam:  General -- oriented x3, NAD, pleasant and cooperative.  HEENT -- Head is normocephalic. Eyes, pupils equal and round and react to light and accommodation. Extraocular motions are intact. Ears, nose and throat were benign.  Neck -- supple; no bruits.  Integument -- intact. No rash, erythema, or ecchymoses.  Chest -- good expansion. Lungs clear to auscultation.  Cardiac -- RRR. No murmurs noted.  Abdomen -- soft, nontender. No masses palpable. Normal bowel sounds present.  CNS -- cranial nerves II through XI grossly intact. Tongue deviation no longer noted  Musculoskeletal - no tenderness or effusions noted. ROM good. 5/5 bilateral strength. Dorsalis pedis pulses present and symmetrical.    Brief Hospital Course:  Patient was admitted presenting with dizziness and Lt-sided weakness/paresthesias beginning earlier that day. Patient reported that all symptoms had resolved at the time of admission. Patient was alone at the time of symptom onset possible facial droop was unknown. Patient denied continued symptoms or any additional symptoms of pain fever or shortness of breath chest pain nausea vomiting diarrhea or headache. Physical exam at the time of admission showed alleviation of symptoms with the exception of left-sided deviation of the tongue (this later returned to baseline the following). Brain CT obtained in the ED and was negative. Neurology consulted on  patient in the ED. Recommendation for her echocardiogram, carotid Doppler, and brain MRI. These were ordered as well as prophylactic therapy of 325 mg aspirin. PT and OT were consulted.  Brain MRA the following day showed pronounced narrowing and irregularity of the right PCA branch vessels and the left pericallosal artery branch of the anterior cerebral artery, which places the patient at considerable risk for infarction in those vascular territories. Neurology recommended continued aspirin 325 mg dosing every day for stroke prevention, continue statin use, and improved glycemic control.  PT and OT saw patient. They found no balance deficits, no focal weakness, and determined he he had no additional needs for their care at that time. Patient was deemed medically stable at that point. And was discharged home with his wife.   Issues for Follow Up:  - Proper glycemic control; A1c - High intensity statin compliance  Significant Procedures: none  Significant Labs and Imaging:   Recent Labs Lab 06/29/14 1719  WBC 6.5  HGB 14.9  HCT 44.0  PLT 266    Recent Labs Lab 06/29/14 1719  NA 139  K 4.6  CL 98  CO2 28  GLUCOSE 110*  BUN 15  CREATININE 0.85  CALCIUM 9.6  ALKPHOS 80  AST 24  ALT 24  ALBUMIN 4.6   CT Head 7/23 IMPRESSION:  1. No acute intracranial process.  2. Chronic right-sided paranasal sinus disease.  MRA Head/Brain 7/24 IMPRESSION:  - Normal appearance of the brain itself at this time.  - Right-sided paranasal sinusitis.  - Pronounced narrowing and irregularity of the right PCA branch  vessels and the left pericallosal artery branch of the anterior  cerebral artery. The patient would be at considerable risk of  infarction in those vascular  territories.  Echocardiogram 7/24 Study Conclusions - Left ventricle: The cavity size was normal. Wall thickness was normal. Systolic function was mildly reduced. The estimated ejection fraction was in the range of 45% to  50%. Diffuse hypokinesis. Doppler parameters are consistent with abnormal left ventricular relaxation (grade 1 diastolic dysfunction).  Carotid Duplex (7/24) Bilateral: 1-39% ICA stenosis lowest end of scale. Vertebral artery flow is antegrade.   Results/Tests Pending at Time of Discharge: none  Discharge Medications:    Medication List         aspirin EC 81 MG tablet  Take 81 mg by mouth daily.     atorvastatin 40 MG tablet  Commonly known as:  LIPITOR  Take 40 mg by mouth daily.     glipiZIDE 5 MG tablet  Commonly known as:  GLUCOTROL  Take 2.5 mg by mouth 2 (two) times daily before a meal.     hydrochlorothiazide 25 MG tablet  Commonly known as:  HYDRODIURIL  Take 25 mg by mouth daily.     metFORMIN 1000 MG tablet  Commonly known as:  GLUCOPHAGE  Take 500 mg by mouth 2 (two) times daily with a meal.     quinapril 20 MG tablet  Commonly known as:  ACCUPRIL  Take 20 mg by mouth daily.        Discharge Instructions: Please refer to Patient Instructions section of EMR for full details.  Patient was counseled important signs and symptoms that should prompt return to medical care, changes in medications, dietary instructions, activity restrictions, and follow up appointments.   Follow-Up Appointments: Follow-up Information   Follow up with Xu,Jindong, MD. Schedule an appointment as soon as possible for a visit in 2 months.   Specialty:  Neurology   Contact information:   [address] [place] [id number] [phone]       Kathee DeltonIan D McKeag, MD 07/01/2014, 11:22 PM PGY-1, [facility]

## 2014-07-02 NOTE — Discharge Summary (Signed)
Family Medicine Teaching Service  Discharge Note : Attending Jeff Javontay Vandam MD Pager [phone] Inpatient Team Pager:  [phone]  I have reviewed this patient and the patient's chart and have discussed discharge planning with the resident at the time of discharge. I agree with the discharge plan as above.    

## 2015-11-12 ENCOUNTER — Encounter (HOSPITAL_COMMUNITY): Payer: Self-pay | Admitting: Emergency Medicine

## 2015-11-12 ENCOUNTER — Emergency Department (HOSPITAL_COMMUNITY)
Admission: EM | Admit: 2015-11-12 | Discharge: 2015-11-12 | Disposition: A | Discharge status: Home / Self Care | Payer: 59 | Attending: Emergency Medicine | Admitting: Emergency Medicine

## 2015-11-12 ENCOUNTER — Emergency Department (HOSPITAL_COMMUNITY): Payer: 59

## 2015-11-12 DIAGNOSIS — Z7982 Long term (current) use of aspirin: Secondary | ICD-10-CM | POA: Insufficient documentation

## 2015-11-12 DIAGNOSIS — R519 Headache, unspecified: Secondary | ICD-10-CM

## 2015-11-12 DIAGNOSIS — Z87891 Personal history of nicotine dependence: Secondary | ICD-10-CM | POA: Insufficient documentation

## 2015-11-12 DIAGNOSIS — S161XXA Strain of muscle, fascia and tendon at neck level, initial encounter: Secondary | ICD-10-CM | POA: Diagnosis not present

## 2015-11-12 DIAGNOSIS — Y998 Other external cause status: Secondary | ICD-10-CM | POA: Diagnosis not present

## 2015-11-12 DIAGNOSIS — M545 Low back pain, unspecified: Secondary | ICD-10-CM

## 2015-11-12 DIAGNOSIS — R51 Headache: Secondary | ICD-10-CM

## 2015-11-12 DIAGNOSIS — E119 Type 2 diabetes mellitus without complications: Secondary | ICD-10-CM | POA: Diagnosis not present

## 2015-11-12 DIAGNOSIS — S3992XA Unspecified injury of lower back, initial encounter: Secondary | ICD-10-CM | POA: Insufficient documentation

## 2015-11-12 DIAGNOSIS — S0990XA Unspecified injury of head, initial encounter: Secondary | ICD-10-CM | POA: Diagnosis not present

## 2015-11-12 DIAGNOSIS — I10 Essential (primary) hypertension: Secondary | ICD-10-CM | POA: Diagnosis not present

## 2015-11-12 DIAGNOSIS — Y9389 Activity, other specified: Secondary | ICD-10-CM | POA: Diagnosis not present

## 2015-11-12 DIAGNOSIS — Z79899 Other long term (current) drug therapy: Secondary | ICD-10-CM | POA: Insufficient documentation

## 2015-11-12 DIAGNOSIS — Y92410 Unspecified street and highway as the place of occurrence of the external cause: Secondary | ICD-10-CM | POA: Insufficient documentation

## 2015-11-12 MED ORDER — CYCLOBENZAPRINE HCL 10 MG PO TABS: 10.0000 mg | ORAL_TABLET | Freq: Two times a day (BID) | ORAL | Status: DC | PRN

## 2015-11-12 NOTE — ED Notes (Signed)
See pa note.  

## 2015-11-12 NOTE — ED Notes (Signed)
Pt transported to Xray. 
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
## 2015-11-12 NOTE — ED Notes (Signed)
Pt returned from xray
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
## 2015-11-20 ENCOUNTER — Encounter (HOSPITAL_COMMUNITY): Payer: Self-pay | Admitting: Emergency Medicine

## 2015-11-20 ENCOUNTER — Emergency Department (HOSPITAL_COMMUNITY)
Admission: EM | Admit: 2015-11-20 | Discharge: 2015-11-21 | Disposition: A | Discharge status: Home / Self Care | Payer: 59 | Attending: Emergency Medicine | Admitting: Emergency Medicine

## 2015-11-20 DIAGNOSIS — Z7982 Long term (current) use of aspirin: Secondary | ICD-10-CM | POA: Insufficient documentation

## 2015-11-20 DIAGNOSIS — Z7984 Long term (current) use of oral hypoglycemic drugs: Secondary | ICD-10-CM | POA: Insufficient documentation

## 2015-11-20 DIAGNOSIS — M25511 Pain in right shoulder: Secondary | ICD-10-CM | POA: Diagnosis not present

## 2015-11-20 DIAGNOSIS — Y9389 Activity, other specified: Secondary | ICD-10-CM | POA: Insufficient documentation

## 2015-11-20 DIAGNOSIS — Y998 Other external cause status: Secondary | ICD-10-CM | POA: Insufficient documentation

## 2015-11-20 DIAGNOSIS — M545 Low back pain: Secondary | ICD-10-CM | POA: Insufficient documentation

## 2015-11-20 DIAGNOSIS — Y92410 Unspecified street and highway as the place of occurrence of the external cause: Secondary | ICD-10-CM | POA: Insufficient documentation

## 2015-11-20 DIAGNOSIS — I10 Essential (primary) hypertension: Secondary | ICD-10-CM | POA: Diagnosis not present

## 2015-11-20 DIAGNOSIS — Z79899 Other long term (current) drug therapy: Secondary | ICD-10-CM | POA: Diagnosis not present

## 2015-11-20 DIAGNOSIS — E119 Type 2 diabetes mellitus without complications: Secondary | ICD-10-CM | POA: Insufficient documentation

## 2015-11-20 DIAGNOSIS — R51 Headache: Secondary | ICD-10-CM | POA: Insufficient documentation

## 2015-11-20 DIAGNOSIS — S161XXA Strain of muscle, fascia and tendon at neck level, initial encounter: Secondary | ICD-10-CM | POA: Insufficient documentation

## 2015-11-20 DIAGNOSIS — Z87891 Personal history of nicotine dependence: Secondary | ICD-10-CM | POA: Insufficient documentation

## 2015-11-20 DIAGNOSIS — M542 Cervicalgia: Secondary | ICD-10-CM | POA: Diagnosis present

## 2015-11-20 MED ORDER — KETOROLAC TROMETHAMINE 30 MG/ML IJ SOLN
30.0000 mg | Freq: Once | INTRAMUSCULAR | Status: DC
  Filled 2015-11-20: qty 1
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
## 2015-11-21 MED ORDER — MELOXICAM 7.5 MG PO TABS: 15.0000 mg | ORAL_TABLET | Freq: Every day | ORAL | Status: DC | PRN

## 2015-11-21 NOTE — Discharge Instructions (Signed)
Cervical Sprain  A cervical sprain is an injury in the neck in which the strong, fibrous tissues (ligaments) that connect your neck bones stretch or tear. Cervical sprains can range from mild to severe. Severe cervical sprains can cause the neck vertebrae to be unstable. This can lead to damage of the spinal cord and can result in serious nervous system problems. The amount of time it takes for a cervical sprain to get better depends on the cause and extent of the injury. Most cervical sprains heal in 1 to 3 weeks.  CAUSES   Severe cervical sprains may be caused by:    Contact sport injuries (such as from football, rugby, wrestling, hockey, auto racing, gymnastics, diving, martial arts, or boxing).    Motor vehicle collisions.    Whiplash injuries. This is an injury from a sudden forward and backward whipping movement of the head and neck.   Falls.   Mild cervical sprains may be caused by:    Being in an awkward position, such as while cradling a telephone between your ear and shoulder.    Sitting in a chair that does not offer proper support.    Working at a poorly designed computer station.    Looking up or down for long periods of time.   SYMPTOMS    Pain, soreness, stiffness, or a burning sensation in the front, back, or sides of the neck. This discomfort may develop immediately after the injury or slowly, 24 hours or more after the injury.    Pain or tenderness directly in the middle of the back of the neck.    Shoulder or upper back pain.    Limited ability to move the neck.    Headache.    Dizziness.    Weakness, numbness, or tingling in the hands or arms.    Muscle spasms.    Difficulty swallowing or chewing.    Tenderness and swelling of the neck.   DIAGNOSIS   Most of the time your health care provider can diagnose a cervical sprain by taking your history and doing a physical exam. Your health care provider will ask about previous neck injuries and any known neck  problems, such as arthritis in the neck. X-rays may be taken to find out if there are any other problems, such as with the bones of the neck. Other tests, such as a CT scan or MRI, may also be needed.   TREATMENT   Treatment depends on the severity of the cervical sprain. Mild sprains can be treated with rest, keeping the neck in place (immobilization), and pain medicines. Severe cervical sprains are immediately immobilized. Further treatment is done to help with pain, muscle spasms, and other symptoms and may include:   Medicines, such as pain relievers, numbing medicines, or muscle relaxants.    Physical therapy. This may involve stretching exercises, strengthening exercises, and posture training. Exercises and improved posture can help stabilize the neck, strengthen muscles, and help stop symptoms from returning.   HOME CARE INSTRUCTIONS    Put ice on the injured area.     Put ice in a plastic bag.     Place a towel between your skin and the bag.     Leave the ice on for 15-20 minutes, 3-4 times a day.    If your injury was severe, you may have been given a cervical collar to wear. A cervical collar is a two-piece collar designed to keep your neck from moving while it heals.      Do not remove the collar unless instructed by your health care provider.    If you have long hair, keep it outside of the collar.    Ask your health care provider before making any adjustments to your collar. Minor adjustments may be required over time to improve comfort and reduce pressure on your chin or on the back of your head.    Ifyou are allowed to remove the collar for cleaning or bathing, follow your health care provider's instructions on how to do so safely.    Keep your collar clean by wiping it with mild soap and water and drying it completely. If the collar you have been given includes removable pads, remove them every 1-2 days and hand wash them with soap and water. Allow them to air dry. They should be completely  dry before you wear them in the collar.    If you are allowed to remove the collar for cleaning and bathing, wash and dry the skin of your neck. Check your skin for irritation or sores. If you see any, tell your health care provider.    Do not drive while wearing the collar.    Only take over-the-counter or prescription medicines for pain, discomfort, or fever as directed by your health care provider.    Keep all follow-up appointments as directed by your health care provider.    Keep all physical therapy appointments as directed by your health care provider.    Make any needed adjustments to your workstation to promote good posture.    Avoid positions and activities that make your symptoms worse.    Warm up and stretch before being active to help prevent problems.   SEEK MEDICAL CARE IF:    Your pain is not controlled with medicine.    You are unable to decrease your pain medicine over time as planned.    Your activity level is not improving as expected.   SEEK IMMEDIATE MEDICAL CARE IF:    You develop any bleeding.   You develop stomach upset.   You have signs of an allergic reaction to your medicine.    Your symptoms get worse.    You develop new, unexplained symptoms.    You have numbness, tingling, weakness, or paralysis in any part of your body.   MAKE SURE YOU:    Understand these instructions.   Will watch your condition.   Will get help right away if you are not doing well or get worse.     This information is not intended to replace advice given to you by your health care provider. Make sure you discuss any questions you have with your health care provider.     Document Released: 09/21/2007 Document Revised: 11/29/2013 Document Reviewed: 06/01/2013  Elsevier Interactive Patient Education 2016 [employer]

## 2015-11-21 NOTE — ED Notes (Signed)
Pt left at this time with all belongings.  

## 2017-04-16 ENCOUNTER — Encounter (HOSPITAL_COMMUNITY): Payer: Self-pay

## 2017-04-16 ENCOUNTER — Emergency Department (HOSPITAL_COMMUNITY)
Admission: EM | Admit: 2017-04-16 | Discharge: 2017-04-16 | Disposition: A | Discharge status: Home / Self Care | Payer: BLUE CROSS/BLUE SHIELD | Attending: Emergency Medicine | Admitting: Emergency Medicine

## 2017-04-16 ENCOUNTER — Emergency Department (HOSPITAL_COMMUNITY): Payer: BLUE CROSS/BLUE SHIELD

## 2017-04-16 DIAGNOSIS — Y999 Unspecified external cause status: Secondary | ICD-10-CM | POA: Insufficient documentation

## 2017-04-16 DIAGNOSIS — Z7982 Long term (current) use of aspirin: Secondary | ICD-10-CM | POA: Diagnosis not present

## 2017-04-16 DIAGNOSIS — Y92410 Unspecified street and highway as the place of occurrence of the external cause: Secondary | ICD-10-CM | POA: Insufficient documentation

## 2017-04-16 DIAGNOSIS — Y939 Activity, unspecified: Secondary | ICD-10-CM | POA: Insufficient documentation

## 2017-04-16 DIAGNOSIS — S199XXA Unspecified injury of neck, initial encounter: Secondary | ICD-10-CM | POA: Insufficient documentation

## 2017-04-16 DIAGNOSIS — M545 Low back pain: Secondary | ICD-10-CM | POA: Insufficient documentation

## 2017-04-16 DIAGNOSIS — Z87891 Personal history of nicotine dependence: Secondary | ICD-10-CM | POA: Diagnosis not present

## 2017-04-16 DIAGNOSIS — E119 Type 2 diabetes mellitus without complications: Secondary | ICD-10-CM | POA: Diagnosis not present

## 2017-04-16 DIAGNOSIS — Z7984 Long term (current) use of oral hypoglycemic drugs: Secondary | ICD-10-CM | POA: Insufficient documentation

## 2017-04-16 DIAGNOSIS — I10 Essential (primary) hypertension: Secondary | ICD-10-CM | POA: Insufficient documentation

## 2017-04-16 MED ORDER — IBUPROFEN 800 MG PO TABS: 800.0000 mg | ORAL_TABLET | Freq: Three times a day (TID) | ORAL | 0 refills | Status: DC

## 2017-04-16 MED ORDER — METHOCARBAMOL 500 MG PO TABS: 500.0000 mg | ORAL_TABLET | Freq: Two times a day (BID) | ORAL | 0 refills | Status: DC
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
## 2017-04-16 NOTE — ED Notes (Signed)
ED Provider at bedside. 
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
## 2017-05-07 ENCOUNTER — Emergency Department (HOSPITAL_COMMUNITY): Payer: BLUE CROSS/BLUE SHIELD

## 2017-05-07 ENCOUNTER — Emergency Department (HOSPITAL_COMMUNITY)
Admission: EM | Admit: 2017-05-07 | Discharge: 2017-05-07 | Disposition: A | Discharge status: Home / Self Care | Payer: BLUE CROSS/BLUE SHIELD | Attending: Emergency Medicine | Admitting: Emergency Medicine

## 2017-05-07 ENCOUNTER — Encounter (HOSPITAL_COMMUNITY): Payer: Self-pay | Admitting: Emergency Medicine

## 2017-05-07 DIAGNOSIS — I10 Essential (primary) hypertension: Secondary | ICD-10-CM | POA: Insufficient documentation

## 2017-05-07 DIAGNOSIS — Z7982 Long term (current) use of aspirin: Secondary | ICD-10-CM | POA: Diagnosis not present

## 2017-05-07 DIAGNOSIS — R0789 Other chest pain: Secondary | ICD-10-CM | POA: Insufficient documentation

## 2017-05-07 DIAGNOSIS — E119 Type 2 diabetes mellitus without complications: Secondary | ICD-10-CM | POA: Diagnosis not present

## 2017-05-07 DIAGNOSIS — Z8673 Personal history of transient ischemic attack (TIA), and cerebral infarction without residual deficits: Secondary | ICD-10-CM | POA: Insufficient documentation

## 2017-05-07 DIAGNOSIS — Z87891 Personal history of nicotine dependence: Secondary | ICD-10-CM | POA: Diagnosis not present

## 2017-05-07 DIAGNOSIS — R079 Chest pain, unspecified: Secondary | ICD-10-CM

## 2017-05-07 DIAGNOSIS — Z79899 Other long term (current) drug therapy: Secondary | ICD-10-CM | POA: Diagnosis not present

## 2017-05-07 DIAGNOSIS — Z7984 Long term (current) use of oral hypoglycemic drugs: Secondary | ICD-10-CM | POA: Insufficient documentation

## 2017-05-07 LAB — BASIC METABOLIC PANEL
Anion gap: 12 (ref 5–15)
BUN: 8 mg/dL (ref 6–20)
CO2: 25 mmol/L (ref 22–32)
Calcium: 9.5 mg/dL (ref 8.9–10.3)
Chloride: 99 mmol/L — ABNORMAL LOW (ref 101–111)
Creatinine, Ser: 0.84 mg/dL (ref 0.61–1.24)
GFR calc Af Amer: >60 mL/min (ref >60)
GFR calc non Af Amer: >60 mL/min (ref >60)
Glucose, Bld: 196 mg/dL — ABNORMAL HIGH (ref 65–99)
Potassium: 3.2 mmol/L — ABNORMAL LOW (ref 3.5–5.1)
Sodium: 136 mmol/L (ref 135–145)

## 2017-05-07 LAB — I-STAT TROPONIN, ED: Troponin i, poc: 0 ng/mL (ref 0.00–0.08)

## 2017-05-07 LAB — CBC
HCT: 42.9 % (ref 39.0–52.0)
Hemoglobin: 14.5 g/dL (ref 13.0–17.0)
MCH: 30.1 pg (ref 26.0–34.0)
MCHC: 33.8 g/dL (ref 30.0–36.0)
MCV: 89 fL (ref 78.0–100.0)
Platelets: 213 10*3/uL (ref 150–400)
RBC: 4.82 MIL/uL (ref 4.22–5.81)
RDW: 12.3 % (ref 11.5–15.5)
WBC: 4.6 10*3/uL (ref 4.0–10.5)

## 2017-05-07 NOTE — ED Provider Notes (Signed)
[facility] Provider Note   CSN: [id number] Arrival date & time: 05/07/17  1342     History   Chief Complaint Chief Complaint  Patient presents with  . Chest Pain    HPI Andre Reed is a 66 y.o. male.  HPI   66 year old male with chest pain. Gradual onset yesterday. He cannot specifically remember what he was doing when he first noticed it. Initially it waxed and waned. He woke up with the same pain this morning and noticed fairly constant throughout the morning and early afternoon. He would occasionally get much sharper pain in his anterior to left chest which lasts a few seconds and then subsides. He is a past history of pleurisy but states the current symptoms feel different. He has not noticed anything that seems to make his pain better or worse. Denies any associated symptoms such as dyspnea, nausea or diaphoresis. No unusual leg pain or swelling. Has past history of hypertension and diabetes. No known CAD that he is aware of.  Past Medical History:  Diagnosis Date  . Diabetes mellitus   . Hypertension     Patient Active Problem List   Diagnosis Date Noted  . TIA (transient ischemic attack) 06/29/2014  . Sinusitis 11/22/2012  . HTN (hypertension) 11/22/2012  . DM (diabetes mellitus) (HCC) 11/22/2012    History reviewed. No pertinent surgical history.     Home Medications    Prior to Admission medications   Medication Sig Start Date End Date Taking? Authorizing Provider  aspirin EC 81 MG tablet Take 81 mg by mouth daily.    [provider]  atorvastatin (LIPITOR) 40 MG tablet Take 40 mg by mouth daily.    [provider]  cyclobenzaprine (FLEXERIL) 10 MG tablet Take 1 tablet (10 mg total) by mouth 2 (two) times daily as needed for muscle spasms. 11/12/15   Teressa LowerPickering, Vrinda, NP  glipiZIDE (GLUCOTROL) 5 MG tablet Take 2.5 mg by mouth 2 (two) times daily before a meal.  11/22/12   Ghimire, Werner LeanShanker M, MD  hydrochlorothiazide (HYDRODIURIL)  25 MG tablet Take 25 mg by mouth daily.  11/17/12   Hayden RasmussenMabe, David, NP  ibuprofen (ADVIL,MOTRIN) 800 MG tablet Take 1 tablet (800 mg total) by mouth 3 (three) times daily. 04/16/17   Felicie MornSmith, David, NP  meloxicam (MOBIC) 7.5 MG tablet Take 2 tablets (15 mg total) by mouth daily as needed for pain. 11/21/15   Raeford RazorKohut, Daimen Shovlin, MD  metFORMIN (GLUCOPHAGE) 1000 MG tablet Take 500 mg by mouth 2 (two) times daily with a meal.     [provider]  methocarbamol (ROBAXIN) 500 MG tablet Take 1 tablet (500 mg total) by mouth 2 (two) times daily. 04/16/17   Felicie MornSmith, David, NP  quinapril (ACCUPRIL) 20 MG tablet Take 20 mg by mouth daily. 11/17/12   Hayden RasmussenMabe, David, NP    Family History Family History  Problem Relation Age of Onset  . Hypertension Mother   . Diabetes Mother     Social History Social History  Substance Use Topics  . Smoking status: Former Games developermoker  . Smokeless tobacco: Never Used  . Alcohol use Yes     Allergies   Patient has no known allergies.   Review of Systems Review of Systems  All systems reviewed and negative, other than as noted in HPI.   Physical Exam Updated Vital Signs BP 132/90 (BP Location: Left Arm)   Pulse 69   Temp 98.1 F (36.7 C) (Oral)   Resp 12   Ht  6' (1.829 m)   Wt 91.6 kg (202 lb)   SpO2 98%   BMI 27.40 kg/m   Physical Exam  Constitutional: He appears well-developed and well-nourished. No distress.  HENT:  Head: Normocephalic and atraumatic.  Eyes: Conjunctivae are normal. Right eye exhibits no discharge. Left eye exhibits no discharge.  Neck: Neck supple.  Cardiovascular: Normal rate, regular rhythm and normal heart sounds.  Exam reveals no gallop and no friction rub.   No murmur heard. Pulmonary/Chest: Effort normal and breath sounds normal. No respiratory distress.  Abdominal: Soft. He exhibits no distension. There is no tenderness.  Musculoskeletal: He exhibits no edema or tenderness.  Lower extremities symmetric as compared to each  other. No calf tenderness. Negative Homan's. No palpable cords.   Neurological: He is alert.  Skin: Skin is warm and dry.  Psychiatric: He has a normal mood and affect. His behavior is normal. Thought content normal.  Nursing note and vitals reviewed.    ED Treatments / Results  Labs (all labs ordered are listed, but only abnormal results are displayed) Labs Reviewed  BASIC METABOLIC PANEL - Abnormal; Notable for the following:       Result Value   Potassium 3.2 (*)    Chloride 99 (*)    Glucose, Bld 196 (*)    All other components within normal limits  CBC  I-STAT TROPOININ, ED    EKG  EKG Interpretation  Date/Time:  Thursday May 07 2017 13:53:00 EDT Ventricular Rate:  83 PR Interval:  180 QRS Duration: 86 QT Interval:  358 QTC Calculation: 420 R Axis:   69 Text Interpretation:  Normal sinus rhythm Normal ECG Confirmed by Juleen China  MD, Paytan Recine [phone]) on 05/07/2017 4:40:38 PM       Radiology Dg Chest 2 View  Result Date: 05/07/2017 CLINICAL DATA:  66 year old presenting with acute onset of chest pain. Current history of hypertension and diabetes. Prior TIA. Former smoker. EXAM: CHEST  2 VIEW COMPARISON:  04/07/2009. FINDINGS: Cardiac silhouette normal in size, unchanged. Thoracic aorta minimally atherosclerotic. Hilar and mediastinal contours otherwise unremarkable. Lungs clear. Bronchovascular markings normal. Pulmonary vascularity normal. No visible pleural effusions. No pneumothorax. Visualized bony thorax intact. IMPRESSION: 1.  No acute cardiopulmonary disease. 2. Minimal thoracic aortic atherosclerosis. Electronically Signed   By: Hulan Saas M.D.   On: 05/07/2017 14:30    Procedures Procedures (including critical care time)  Medications Ordered in ED Medications - No data to display   Initial Impression / Assessment and Plan / ED Course  I have reviewed the triage vital signs and the nursing notes.  Pertinent labs & imaging results that were available  during my care of the patient were reviewed by me and considered in my medical decision making (see chart for details).     66yM with CP. I doubt ACS, PE, dissection or other emergent process.   Final Clinical Impressions(s) / ED Diagnoses   Final diagnoses:  Nonspecific chest pain    New Prescriptions New Prescriptions   No medications on file     Raeford Razor, MD 05/10/17 2051

## 2017-05-07 NOTE — ED Triage Notes (Signed)
To ed with c/o chest pain, left sided, intermittent -- does not hurt at present, states when it comes it is about "a 10" . States has a hx of pleurisy.

## 2017-05-07 NOTE — ED Notes (Signed)
Pt denies chest pain at this time.

## 2017-05-07 NOTE — ED Notes (Signed)
Pt stable, understands discharge instructions, and reasons for return.   

## 2017-09-03 ENCOUNTER — Encounter (HOSPITAL_COMMUNITY): Payer: Self-pay | Admitting: Emergency Medicine

## 2017-09-03 ENCOUNTER — Emergency Department (HOSPITAL_COMMUNITY)
Admission: EM | Admit: 2017-09-03 | Discharge: 2017-09-03 | Disposition: A | Discharge status: Home / Self Care | Payer: BLUE CROSS/BLUE SHIELD | Attending: Emergency Medicine | Admitting: Emergency Medicine

## 2017-09-03 ENCOUNTER — Emergency Department (HOSPITAL_COMMUNITY): Payer: BLUE CROSS/BLUE SHIELD

## 2017-09-03 DIAGNOSIS — Z7982 Long term (current) use of aspirin: Secondary | ICD-10-CM | POA: Insufficient documentation

## 2017-09-03 DIAGNOSIS — E119 Type 2 diabetes mellitus without complications: Secondary | ICD-10-CM | POA: Insufficient documentation

## 2017-09-03 DIAGNOSIS — Y999 Unspecified external cause status: Secondary | ICD-10-CM | POA: Insufficient documentation

## 2017-09-03 DIAGNOSIS — Z7984 Long term (current) use of oral hypoglycemic drugs: Secondary | ICD-10-CM | POA: Insufficient documentation

## 2017-09-03 DIAGNOSIS — Y929 Unspecified place or not applicable: Secondary | ICD-10-CM | POA: Insufficient documentation

## 2017-09-03 DIAGNOSIS — I10 Essential (primary) hypertension: Secondary | ICD-10-CM | POA: Insufficient documentation

## 2017-09-03 DIAGNOSIS — Y939 Activity, unspecified: Secondary | ICD-10-CM | POA: Insufficient documentation

## 2017-09-03 DIAGNOSIS — M542 Cervicalgia: Secondary | ICD-10-CM | POA: Diagnosis present

## 2017-09-03 DIAGNOSIS — M545 Low back pain: Secondary | ICD-10-CM | POA: Diagnosis not present

## 2017-09-03 DIAGNOSIS — Z87891 Personal history of nicotine dependence: Secondary | ICD-10-CM | POA: Insufficient documentation

## 2017-09-03 HISTORY — DX: Other cervical disc degeneration, unspecified cervical region: M50.30

## 2017-09-03 MED ORDER — CYCLOBENZAPRINE HCL 10 MG PO TABS: 10.0000 mg | ORAL_TABLET | Freq: Two times a day (BID) | ORAL | 0 refills | Status: DC | PRN

## 2017-09-03 MED ORDER — NAPROXEN 375 MG PO TABS: 375.0000 mg | ORAL_TABLET | Freq: Two times a day (BID) | ORAL | 0 refills | Status: DC
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
## 2017-09-03 NOTE — ED Notes (Signed)
ED Provider at bedside. 

## 2018-05-11 ENCOUNTER — Other Ambulatory Visit: Payer: Self-pay | Admitting: Rehabilitation

## 2018-05-11 ENCOUNTER — Ambulatory Visit
Admission: RE | Admit: 2018-05-11 | Discharge: 2018-05-11 | Disposition: A | Discharge status: Home / Self Care | Payer: BLUE CROSS/BLUE SHIELD | Source: Ambulatory Visit | Attending: Rehabilitation | Admitting: Rehabilitation

## 2018-05-11 DIAGNOSIS — M542 Cervicalgia: Secondary | ICD-10-CM

## 2020-05-12 ENCOUNTER — Encounter (HOSPITAL_COMMUNITY): Payer: Self-pay

## 2020-05-12 ENCOUNTER — Emergency Department (HOSPITAL_COMMUNITY)
Admission: EM | Admit: 2020-05-12 | Discharge: 2020-05-12 | Disposition: A | Discharge status: Home / Self Care | Payer: Medicare HMO | Attending: Emergency Medicine | Admitting: Emergency Medicine

## 2020-05-12 ENCOUNTER — Emergency Department (HOSPITAL_COMMUNITY): Payer: Medicare HMO

## 2020-05-12 ENCOUNTER — Other Ambulatory Visit: Payer: Self-pay

## 2020-05-12 DIAGNOSIS — R519 Headache, unspecified: Secondary | ICD-10-CM | POA: Diagnosis present

## 2020-05-12 DIAGNOSIS — Z7982 Long term (current) use of aspirin: Secondary | ICD-10-CM | POA: Diagnosis not present

## 2020-05-12 DIAGNOSIS — Z7984 Long term (current) use of oral hypoglycemic drugs: Secondary | ICD-10-CM | POA: Diagnosis not present

## 2020-05-12 DIAGNOSIS — I10 Essential (primary) hypertension: Secondary | ICD-10-CM | POA: Insufficient documentation

## 2020-05-12 DIAGNOSIS — Z79899 Other long term (current) drug therapy: Secondary | ICD-10-CM | POA: Insufficient documentation

## 2020-05-12 DIAGNOSIS — E119 Type 2 diabetes mellitus without complications: Secondary | ICD-10-CM | POA: Insufficient documentation

## 2020-05-12 DIAGNOSIS — M542 Cervicalgia: Secondary | ICD-10-CM

## 2020-05-12 DIAGNOSIS — Z8673 Personal history of transient ischemic attack (TIA), and cerebral infarction without residual deficits: Secondary | ICD-10-CM | POA: Insufficient documentation

## 2020-05-12 DIAGNOSIS — M25562 Pain in left knee: Secondary | ICD-10-CM | POA: Insufficient documentation

## 2020-05-12 MED ORDER — NAPROXEN 375 MG PO TABS: 375.0000 mg | ORAL_TABLET | Freq: Two times a day (BID) | ORAL | 0 refills | Status: AC

## 2020-05-12 MED ORDER — NAPROXEN 250 MG PO TABS
375.0000 mg | ORAL_TABLET | Freq: Once | ORAL | Status: AC
  Administered 2020-05-12: 375 mg via ORAL
  Filled 2020-05-12: qty 2

## 2020-05-12 MED ORDER — CYCLOBENZAPRINE HCL 10 MG PO TABS
10.0000 mg | ORAL_TABLET | Freq: Once | ORAL | Status: AC
  Administered 2020-05-12: 10 mg via ORAL
  Filled 2020-05-12: qty 1

## 2020-05-12 MED ORDER — CYCLOBENZAPRINE HCL 10 MG PO TABS: 10.0000 mg | ORAL_TABLET | Freq: Two times a day (BID) | ORAL | 0 refills | Status: AC | PRN

## 2020-05-12 NOTE — ED Notes (Signed)
Patient transported to CT 
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
## 2020-05-12 NOTE — Discharge Instructions (Signed)
Your x-rays along with your CT scan were within normal limits today.  I have prescribed a short course of muscle relaxers, anti-inflammatories to take for your symptoms.  Please also apply heating pad or ice to your neck and back.  Follow-up with your primary care physician as needed.
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
## 2021-12-16 NOTE — Progress Notes (Deleted)
Date:  12/16/2021   ID:  [name], DOB 01-04-1951, MRN [id number]  PCP:  [facility]  Cardiologist:  Rex Kras, DO, [facility] (established care 12/17/2021) Former Cardiology Providers: ***  REASON FOR CONSULT: Cardiac murmur  REQUESTING PHYSICIAN:  [facility] [place] [place],  [place] [address]  No chief complaint on file.   HPI  Andre Reed is a 71 y.o. *** male who presents to the office with a chief complaint of "***." Patient's past medical history and cardiovascular risk factors include: hypertension, ***  He is referred to the office at the request of [place] for evaluation of cardiac murmur.  ***  History of  Denies prior history of coronary artery disease, myocardial infarction, congestive heart failure, deep venous thrombosis, pulmonary embolism, stroke, transient ischemic attack.  FUNCTIONAL STATUS: ***   ALLERGIES: No Known Allergies  MEDICATION LIST PRIOR TO VISIT: No outpatient medications have been marked as taking for the 12/17/21 encounter (Appointment) with Rex Kras, DO.     PAST MEDICAL HISTORY: Past Medical History:  Diagnosis Date   DDD (degenerative disc disease), cervical    Diabetes mellitus    Hypertension     PAST SURGICAL HISTORY: No past surgical history on file.  FAMILY HISTORY: The patient family history includes Diabetes in his mother; Hypertension in his mother.  SOCIAL HISTORY:  The patient  reports that he has quit smoking. He has never used smokeless tobacco. He reports current alcohol use. He reports that he does not use drugs.  REVIEW OF SYSTEMS: ROS  PHYSICAL EXAM: Vitals with BMI 05/12/2020 05/12/2020 05/12/2020  Height - _0  -  Weight - 198 lbs -  BMI - 11.73 -  Systolic 567 - 014  Diastolic 67 - 77  Pulse 87 - 92    CONSTITUTIONAL: Well-developed and well-nourished. No acute distress.  SKIN: Skin is warm and dry. No rash noted. No cyanosis. No  pallor. No jaundice HEAD: Normocephalic and atraumatic.  EYES: No scleral icterus MOUTH/THROAT: Moist oral membranes.  NECK: No JVD present. No thyromegaly noted. No carotid bruits  LYMPHATIC: No visible cervical adenopathy.  CHEST Normal respiratory effort. No intercostal retractions  LUNGS: ***Clear to auscultation bilaterally.  No stridor. No wheezes. No rales.  CARDIOVASCULAR: ***Regular rate and rhythm, positive S1-S2, no murmurs rubs or gallops appreciated. ABDOMINAL: *** No apparent ascites.  EXTREMITIES: No peripheral edema, warm to touch, ***DP and PT pulses HEMATOLOGIC: No significant bruising NEUROLOGIC: Oriented to person, place, and time. Nonfocal. Normal muscle tone.  PSYCHIATRIC: Normal mood and affect. Normal behavior. Cooperative  CARDIAC DATABASE: EKG: ***  Echocardiogram: No results found for this or any previous visit from the past 1095 days.    Stress Testing: No results found for this or any previous visit from the past 1095 days.   Heart Catheterization: None  LABORATORY DATA: CBC Latest Ref Rng & Units 05/07/2017 06/29/2014 08/19/2010  WBC 4.0 - 10.5 K/uL 4.6 6.5 -  Hemoglobin 13.0 - 17.0 g/dL 14.5 14.9 16.3  Hematocrit 39.0 - 52.0 % 42.9 44.0 48.0  Platelets 150 - 400 K/uL 213 266 -    CMP Latest Ref Rng & Units 05/07/2017 06/29/2014 11/29/2012  Glucose 65 - 99 mg/dL 196(H) 110(H) 152(H)  BUN 6 - 20 mg/dL _1 Creatinine 0.61 - 1.24 mg/dL 0.84 0.85 0.76  Sodium 135 - 145 mmol/L 136 139 136  Potassium 3.5 - 5.1 mmol/L 3.2(L) 4.6 3.9  Chloride  101 - 111 mmol/L 99(L) 98 99  CO2 22 - 32 mmol/L _0 Calcium 8.9 - 10.3 mg/dL 9.5 9.6 9.6  Total Protein 6.0 - 8.3 g/dL - 8.2 7.5  Total Bilirubin 0.3 - 1.2 mg/dL - 0.5 0.4  Alkaline Phos 39 - 117 U/L - 80 64  AST 0 - 37 U/L - 24 17  ALT 0 - 53 U/L - 24 22    Lipid Panel     Component Value Date/Time   CHOL 145 06/30/2014 0038   TRIG 106 06/30/2014 0038   HDL 43 06/30/2014 0038   CHOLHDL 3.4  06/30/2014 0038   VLDL 21 06/30/2014 0038   LDLCALC 81 06/30/2014 0038    No components found for: NTPROBNP No results for input(s): PROBNP in the last 8760 hours. No results for input(s): TSH in the last 8760 hours.  BMP No results for input(s): NA, K, CL, CO2, GLUCOSE, BUN, CREATININE, CALCIUM, GFRNONAA, GFRAA in the last 8760 hours.  HEMOGLOBIN A1C Lab Results  Component Value Date   HGBA1C 8.4 (H) 06/30/2014   MPG 194 (H) 06/30/2014    IMPRESSION:  External Labs:  Date Collected: 11/23/2021 , information obtained by *** Potassium: 3.9 Creatinine 0.86 mg/dL. eGFR: 94 mL/min per 1.73 m Hemoglobin: 15.5 g/dL and hematocrit: 45.6 % Lipid profile: Total cholesterol 126 , triglycerides 87 , HDL 35 , LDL 74 AST: 45 , ALT: 37 , alkaline phosphatase: 47  Hemoglobin A1c: 7.0 TSH: ***    No diagnosis found.   RECOMMENDATIONS: Andre Reed is a 71 y.o. *** male whose past medical history and cardiac risk factors include: ***   FINAL MEDICATION LIST END OF ENCOUNTER: No orders of the defined types were placed in this encounter.   There are no discontinued medications.   Current Outpatient Medications:    aspirin EC 81 MG tablet, Take 81 mg by mouth daily., Disp: , Rfl:    atorvastatin (LIPITOR) 40 MG tablet, Take 40 mg by mouth daily., Disp: , Rfl:    glipiZIDE (GLUCOTROL) 5 MG tablet, Take 2.5 mg by mouth 2 (two) times daily before a meal. , Disp: , Rfl:    hydrochlorothiazide (HYDRODIURIL) 25 MG tablet, Take 25 mg by mouth daily. , Disp: , Rfl:    ibuprofen (ADVIL,MOTRIN) 800 MG tablet, Take 1 tablet (800 mg total) by mouth 3 (three) times daily., Disp: 21 tablet, Rfl: 0   meloxicam (MOBIC) 7.5 MG tablet, Take 2 tablets (15 mg total) by mouth daily as needed for pain., Disp: 7 tablet, Rfl: 0   metFORMIN (GLUCOPHAGE) 1000 MG tablet, Take 500 mg by mouth 2 (two) times daily with a meal. , Disp: , Rfl:    methocarbamol (ROBAXIN) 500 MG tablet, Take 1 tablet (500 mg total)  by mouth 2 (two) times daily., Disp: 20 tablet, Rfl: 0   quinapril (ACCUPRIL) 20 MG tablet, Take 20 mg by mouth daily., Disp: , Rfl:   No orders of the defined types were placed in this encounter.   There are no Patient Instructions on file for this visit.   --Continue cardiac medications as reconciled in final medication list. --No follow-ups on file. Or sooner if needed. --Continue follow-up with your primary care physician regarding the management of your other chronic comorbid conditions.  Patient's questions and concerns were addressed to his satisfaction. He voices understanding of the instructions provided during this encounter.   This note was created using a voice recognition software as a result there may  be grammatical errors inadvertently enclosed that do not reflect the nature of this encounter. Every attempt is made to correct such errors.  Rex Kras, [place], [facility]  Pager: [phone] Office: [phone]

## 2021-12-17 ENCOUNTER — Ambulatory Visit: Payer: Self-pay | Admitting: Cardiology

## 2022-04-22 ENCOUNTER — Emergency Department (HOSPITAL_COMMUNITY)
Admission: EM | Admit: 2022-04-22 | Discharge: 2022-04-22 | Disposition: A | Discharge status: Home / Self Care | Payer: Medicare HMO | Attending: Emergency Medicine | Admitting: Emergency Medicine

## 2022-04-22 ENCOUNTER — Other Ambulatory Visit: Payer: Self-pay

## 2022-04-22 ENCOUNTER — Encounter (HOSPITAL_COMMUNITY): Payer: Self-pay

## 2022-04-22 ENCOUNTER — Emergency Department (HOSPITAL_COMMUNITY): Payer: Medicare HMO

## 2022-04-22 DIAGNOSIS — Y92410 Unspecified street and highway as the place of occurrence of the external cause: Secondary | ICD-10-CM | POA: Insufficient documentation

## 2022-04-22 DIAGNOSIS — Z79899 Other long term (current) drug therapy: Secondary | ICD-10-CM | POA: Diagnosis not present

## 2022-04-22 DIAGNOSIS — S0990XA Unspecified injury of head, initial encounter: Secondary | ICD-10-CM | POA: Diagnosis not present

## 2022-04-22 DIAGNOSIS — S51012A Laceration without foreign body of left elbow, initial encounter: Secondary | ICD-10-CM | POA: Insufficient documentation

## 2022-04-22 DIAGNOSIS — M545 Low back pain, unspecified: Secondary | ICD-10-CM | POA: Diagnosis not present

## 2022-04-22 DIAGNOSIS — Z23 Encounter for immunization: Secondary | ICD-10-CM | POA: Insufficient documentation

## 2022-04-22 DIAGNOSIS — S59902A Unspecified injury of left elbow, initial encounter: Secondary | ICD-10-CM | POA: Diagnosis present

## 2022-04-22 DIAGNOSIS — M542 Cervicalgia: Secondary | ICD-10-CM | POA: Insufficient documentation

## 2022-04-22 MED ORDER — TETANUS-DIPHTH-ACELL PERTUSSIS 5-2.5-18.5 LF-MCG/0.5 IM SUSY
0.5000 mL | PREFILLED_SYRINGE | Freq: Once | INTRAMUSCULAR | Status: AC
  Administered 2022-04-22: 0.5 mL via INTRAMUSCULAR
  Filled 2022-04-22: qty 0.5

## 2022-04-22 MED ORDER — LIDOCAINE-EPINEPHRINE (PF) 2 %-1:200000 IJ SOLN
10.0000 mL | Freq: Once | INTRAMUSCULAR | Status: AC
  Administered 2022-04-22: 10 mL
  Filled 2022-04-22: qty 20

## 2022-04-22 MED ORDER — METHOCARBAMOL 500 MG PO TABS: 500.0000 mg | ORAL_TABLET | Freq: Every evening | ORAL | 0 refills | Status: DC
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
## 2022-04-22 NOTE — Discharge Instructions (Addendum)
It was a pleasure taking care of you today! ? ?Your imaging in the ED was negative for fracture or dislocations. You will feel more sore in the morning. You are prescribed Robaxin (muscle relaxer). Do not drive or operate heavy machinery while taking the muscle relaxer. You may take over the counter 600 mg Ibuprofen every 6 hours or 1,000 mg Tylenol every 6 hours as needed for pain for no more than 7 days. You may return to urgent care, primary care doctor, ED to have your sutures removed in 7-10 days. Keep the area clean and dry. Your tetanus was updated in the ED. You may apply ice or heat to affected area for up to 15 minutes at a time. Ensure to place a barrier between your skin and the ice/heat. Return to the Emergency Department if you are experiencing increasing/worsening headache, vision changes, unable to keep food down, drainage, fever, redness, or worsening symptoms.  ?

## 2022-11-15 ENCOUNTER — Encounter (HOSPITAL_BASED_OUTPATIENT_CLINIC_OR_DEPARTMENT_OTHER): Payer: Self-pay

## 2022-11-15 ENCOUNTER — Emergency Department (HOSPITAL_BASED_OUTPATIENT_CLINIC_OR_DEPARTMENT_OTHER): Payer: Medicare HMO | Admitting: Radiology

## 2022-11-15 ENCOUNTER — Emergency Department (HOSPITAL_BASED_OUTPATIENT_CLINIC_OR_DEPARTMENT_OTHER)
Admission: EM | Admit: 2022-11-15 | Discharge: 2022-11-15 | Disposition: A | Discharge status: Home / Self Care | Payer: Medicare HMO | Attending: Emergency Medicine | Admitting: Emergency Medicine

## 2022-11-15 DIAGNOSIS — R059 Cough, unspecified: Secondary | ICD-10-CM | POA: Diagnosis present

## 2022-11-15 DIAGNOSIS — R739 Hyperglycemia, unspecified: Secondary | ICD-10-CM | POA: Insufficient documentation

## 2022-11-15 DIAGNOSIS — Z20822 Contact with and (suspected) exposure to covid-19: Secondary | ICD-10-CM | POA: Diagnosis not present

## 2022-11-15 DIAGNOSIS — J40 Bronchitis, not specified as acute or chronic: Secondary | ICD-10-CM | POA: Diagnosis not present

## 2022-11-15 LAB — RESP PANEL BY RT-PCR (RSV, FLU A&B, COVID)  RVPGX2
Influenza A by PCR: NEGATIVE
Influenza B by PCR: NEGATIVE
Resp Syncytial Virus by PCR: NEGATIVE
SARS Coronavirus 2 by RT PCR: NEGATIVE

## 2022-11-15 LAB — BASIC METABOLIC PANEL
Anion gap: 11 (ref 5–15)
BUN: 9 mg/dL (ref 8–23)
CO2: 29 mmol/L (ref 22–32)
Calcium: 9.7 mg/dL (ref 8.9–10.3)
Chloride: 96 mmol/L — ABNORMAL LOW (ref 98–111)
Creatinine, Ser: 0.77 mg/dL (ref 0.61–1.24)
GFR, Estimated: >60 mL/min (ref >60)
Glucose, Bld: 170 mg/dL — ABNORMAL HIGH (ref 70–99)
Potassium: 3.5 mmol/L (ref 3.5–5.1)
Sodium: 136 mmol/L (ref 135–145)

## 2022-11-15 LAB — CBC
HCT: 45.4 % (ref 39.0–52.0)
Hemoglobin: 15 g/dL (ref 13.0–17.0)
MCH: 30.2 pg (ref 26.0–34.0)
MCHC: 33 g/dL (ref 30.0–36.0)
MCV: 91.3 fL (ref 80.0–100.0)
Platelets: 364 10*3/uL (ref 150–400)
RBC: 4.97 MIL/uL (ref 4.22–5.81)
RDW: 12.9 % (ref 11.5–15.5)
WBC: 4.7 10*3/uL (ref 4.0–10.5)
nRBC: 0 % (ref 0.0–0.2)

## 2022-11-15 LAB — TROPONIN I (HIGH SENSITIVITY)
Troponin I (High Sensitivity): 5 ng/L (ref <18)
Troponin I (High Sensitivity): 4 ng/L (ref <18)

## 2022-11-15 LAB — D-DIMER, QUANTITATIVE: D-Dimer, Quant: <0.27 ug/mL-FEU (ref 0.00–0.50)

## 2022-11-15 MED ORDER — PREDNISONE 10 MG PO TABS: 60.0000 mg | ORAL_TABLET | Freq: Every day | ORAL | 0 refills | Status: AC

## 2022-11-15 MED ORDER — BENZONATATE 100 MG PO CAPS: 100.0000 mg | ORAL_CAPSULE | Freq: Three times a day (TID) | ORAL | 0 refills | Status: DC

## 2022-11-15 MED ORDER — AZITHROMYCIN 250 MG PO TABS: 250.0000 mg | ORAL_TABLET | Freq: Every day | ORAL | 0 refills | Status: DC

## 2022-11-15 NOTE — ED Provider Notes (Signed)
[facility] EMERGENCY DEPT Provider Note   CSN: [id number] Arrival date & time: 11/15/22  1453     History {Add pertinent medical, surgical, social history, OB history to HPI:1} No chief complaint on file.   Andre Reed is a 71 y.o. male.  HPI     Home Medications Prior to Admission medications   Medication Sig Start Date End Date Taking? Authorizing Provider  atorvastatin (LIPITOR) 40 MG tablet Take 40 mg by mouth daily.    [provider]  hydrochlorothiazide (HYDRODIURIL) 25 MG tablet Take 25 mg by mouth daily.  11/17/12   Hayden Rasmussen, NP  ibuprofen (ADVIL,MOTRIN) 800 MG tablet Take 1 tablet (800 mg total) by mouth 3 (three) times daily. Patient not taking: Reported on 04/22/2022 04/16/17   Felicie Morn, NP  JARDIANCE 25 MG TABS tablet Take 25 mg by mouth daily. 01/30/22   [provider]  losartan (COZAAR) 50 MG tablet Take 50 mg by mouth daily. 02/23/22   [provider]  meloxicam (MOBIC) 7.5 MG tablet Take 2 tablets (15 mg total) by mouth daily as needed for pain. Patient not taking: Reported on 04/22/2022 11/21/15   Raeford Razor, MD  metFORMIN (GLUCOPHAGE) 1000 MG tablet Take 1,000 mg by mouth 2 (two) times daily with a meal.    [provider]  methocarbamol (ROBAXIN) 500 MG tablet Take 1 tablet (500 mg total) by mouth at bedtime. 04/22/22   Blue, Soijett A, PA-C  rosuvastatin (CRESTOR) 20 MG tablet Take 20 mg by mouth daily. 03/29/22   [provider]  RYBELSUS 14 MG TABS Take 1 tablet by mouth daily. 01/30/22   [provider]      Allergies    Patient has no known allergies.    Review of Systems   Review of Systems  Physical Exam Updated Vital Signs There were no vitals taken for this visit. Physical Exam  ED Results / Procedures / Treatments   Labs (all labs ordered are listed, but only abnormal results are displayed) Labs Reviewed  BASIC METABOLIC PANEL - Abnormal; Notable for the  following components:      Result Value   Chloride 96 (*)    Glucose, Bld 170 (*)    All other components within normal limits  CBC  TROPONIN I (HIGH SENSITIVITY)    EKG None  Radiology DG Chest 2 View  Result Date: 11/15/2022 CLINICAL DATA:  Cough, dizziness EXAM: CHEST - 2 VIEW COMPARISON:  05/12/2020 FINDINGS: The heart size and mediastinal contours are within normal limits. Both lungs are clear. The visualized skeletal structures are unremarkable. IMPRESSION: No active cardiopulmonary disease. Electronically Signed   By: Ernie Avena M.D.   On: 11/15/2022 15:48    Procedures Procedures  {Document cardiac monitor, telemetry assessment procedure when appropriate:1}  Medications Ordered in ED Medications - No data to display  ED Course/ Medical Decision Making/ A&P                           Medical Decision Making Amount and/or Complexity of Data Reviewed Labs: ordered. Radiology: ordered.  Risk Prescription drug management.   ***  Of note, the patient was offered TB testing but declined.   {Document critical care time when appropriate:1} {Document review of labs and clinical decision tools ie heart score, Chads2Vasc2 etc:1}  {Document your independent review of radiology images, and any outside records:1} {Document your discussion with family members, caretakers, and with consultants:1} {Document social determinants  of health affecting pt's care:1} {Document your decision making why or why not admission, treatments were needed:1} Final Clinical Impression(s) / ED Diagnoses Final diagnoses:  None    Rx / DC Orders ED Discharge Orders     None

## 2022-11-15 NOTE — ED Triage Notes (Signed)
He c/o right-sided chest discomfort plus cough for just over a week.

## 2022-11-15 NOTE — Discharge Instructions (Addendum)
Your laboratory testing was reassuring.  Your chest x-ray showed no focal consolidation to suggest bacterial pneumonia.  Your symptoms could be due to persistent viral bronchitis although walking pneumonia caused by an atypical organism could also explain your symptoms.  Due to this we will treat you with a course of azithromycin and steroids and have you follow-up outpatient.  Return for worsening symptoms.

## 2023-04-26 ENCOUNTER — Emergency Department (HOSPITAL_BASED_OUTPATIENT_CLINIC_OR_DEPARTMENT_OTHER)
Admission: EM | Admit: 2023-04-26 | Discharge: 2023-04-26 | Disposition: A | Discharge status: Home / Self Care | Payer: Medicare HMO | Attending: Emergency Medicine | Admitting: Emergency Medicine

## 2023-04-26 ENCOUNTER — Encounter (HOSPITAL_BASED_OUTPATIENT_CLINIC_OR_DEPARTMENT_OTHER): Payer: Self-pay

## 2023-04-26 ENCOUNTER — Other Ambulatory Visit: Payer: Self-pay

## 2023-04-26 ENCOUNTER — Emergency Department (HOSPITAL_BASED_OUTPATIENT_CLINIC_OR_DEPARTMENT_OTHER): Payer: Medicare HMO

## 2023-04-26 DIAGNOSIS — I10 Essential (primary) hypertension: Secondary | ICD-10-CM | POA: Insufficient documentation

## 2023-04-26 DIAGNOSIS — M5431 Sciatica, right side: Secondary | ICD-10-CM | POA: Insufficient documentation

## 2023-04-26 DIAGNOSIS — M79604 Pain in right leg: Secondary | ICD-10-CM | POA: Diagnosis present

## 2023-04-26 DIAGNOSIS — Z7984 Long term (current) use of oral hypoglycemic drugs: Secondary | ICD-10-CM | POA: Diagnosis not present

## 2023-04-26 DIAGNOSIS — Z79899 Other long term (current) drug therapy: Secondary | ICD-10-CM | POA: Diagnosis not present

## 2023-04-26 DIAGNOSIS — E119 Type 2 diabetes mellitus without complications: Secondary | ICD-10-CM | POA: Diagnosis not present

## 2023-04-26 MED ORDER — GABAPENTIN 100 MG PO CAPS: 100.0000 mg | ORAL_CAPSULE | Freq: Three times a day (TID) | ORAL | 0 refills | Status: AC | PRN

## 2023-04-26 NOTE — ED Notes (Signed)
Patient called for triage multiple times.  Patient found to be in restroom.  Will pull back to triage when exits restroom.

## 2023-04-26 NOTE — ED Triage Notes (Signed)
Patient here POV from Home.  Endorses Right Hip Pain that began 4 Days ago. Subsided mostly since (Had XR imaging completed with PCP). Was given Prednisone and Pain has been decreasing but today during church he noted Pain to Right Upper Anterior and Medial leg.   Worse with Movements. No Dysuria. No Trauma.   NAD Noted during Triage. A&Ox4. GCS 15. Ambulatory.

## 2023-04-26 NOTE — ED Provider Notes (Signed)
[place] EMERGENCY DEPARTMENT AT [facility] Provider Note   CSN: [id number] Arrival date & time: 04/26/23  1420     History  Chief Complaint  Patient presents with   Leg Pain    Andre Reed is a 72 y.o. male with PMHx of degenerative disc disease, HTN, and DM who presents to ED complaining of right hip pain x4 days. Patient's PCP prescribed him with steroid taper which has been relieving pain until today. Outpatient xray of right hip obtained - but patient is not aware of results. Patient complains of increased pain radiating from hip to upper right thigh today during church.   Denies recent trauma, numbness, paresthesias, leg swelling/tenderness. Denies history of blood clots, recent immobilization, recent surgeries, history of cancer.   Leg Pain      Home Medications Prior to Admission medications   Medication Sig Start Date End Date Taking? Authorizing Provider  atorvastatin (LIPITOR) 40 MG tablet Take 40 mg by mouth daily.    [provider]  azithromycin (ZITHROMAX) 250 MG tablet Take 1 tablet (250 mg total) by mouth daily. Take first 2 tablets together, then 1 every day until finished. 11/15/22   Ernie Avena, MD  benzonatate (TESSALON) 100 MG capsule Take 1 capsule (100 mg total) by mouth every 8 (eight) hours. 11/15/22   Ernie Avena, MD  hydrochlorothiazide (HYDRODIURIL) 25 MG tablet Take 25 mg by mouth daily.  11/17/12   Hayden Rasmussen, NP  ibuprofen (ADVIL,MOTRIN) 800 MG tablet Take 1 tablet (800 mg total) by mouth 3 (three) times daily. Patient not taking: Reported on 04/22/2022 04/16/17   Felicie Morn, NP  JARDIANCE 25 MG TABS tablet Take 25 mg by mouth daily. 01/30/22   [provider]  losartan (COZAAR) 50 MG tablet Take 50 mg by mouth daily. 02/23/22   [provider]  meloxicam (MOBIC) 7.5 MG tablet Take 2 tablets (15 mg total) by mouth daily as needed for pain. Patient not taking: Reported on 04/22/2022 11/21/15   Raeford Razor, MD  metFORMIN (GLUCOPHAGE) 1000 MG tablet Take 1,000 mg by mouth 2 (two) times daily with a meal.    [provider]  methocarbamol (ROBAXIN) 500 MG tablet Take 1 tablet (500 mg total) by mouth at bedtime. 04/22/22   Blue, Soijett A, PA-C  rosuvastatin (CRESTOR) 20 MG tablet Take 20 mg by mouth daily. 03/29/22   [provider]  RYBELSUS 14 MG TABS Take 1 tablet by mouth daily. 01/30/22   [provider]      Allergies    Patient has no known allergies.    Review of Systems   Review of Systems  Musculoskeletal:        Hip pain    Physical Exam Updated Vital Signs BP 125/75 (BP Location: Right Arm)   Pulse 88   Temp 97.9 F (36.6 C)   Resp 15   Ht 6' (1.829 m)   Wt 89.8 kg   SpO2 98%   BMI 26.85 kg/m  Physical Exam Vitals and nursing note reviewed.  Constitutional:      General: He is not in acute distress.    Appearance: He is not ill-appearing or toxic-appearing.  HENT:     Head: Normocephalic and atraumatic.  Eyes:     General: No scleral icterus.       Right eye: No discharge.        Left eye: No discharge.     Conjunctiva/sclera: Conjunctivae normal.  Cardiovascular:  Rate and Rhythm: Normal rate.  Pulmonary:     Effort: Pulmonary effort is normal.  Abdominal:     General: Abdomen is flat.  Musculoskeletal:        General: Normal range of motion.     Right lower leg: No edema.     Left lower leg: No edema.     Comments: Full active ROM of bilateral hips and knees. No weakness. No pitting edema or swelling of bilateral legs. Legs are neurovascularly intact - brisk capillary toe refill and +2 pedal pulse. No numbness/tingling of legs. Patient ambulating in room without concern.  Skin:    General: Skin is warm and dry.     Capillary Refill: Capillary refill takes less than 2 seconds.     Findings: No bruising, erythema or rash.  Neurological:     General: No focal deficit present.     Mental Status: He is alert. Mental  status is at baseline.     Sensory: No sensory deficit.     Motor: No weakness.  Psychiatric:        Mood and Affect: Mood normal.        Behavior: Behavior normal.     ED Results / Procedures / Treatments   Labs (all labs ordered are listed, but only abnormal results are displayed) Labs Reviewed - No data to display  EKG None  Radiology No results found.  Procedures Procedures    Medications Ordered in ED Medications - No data to display  ED Course/ Medical Decision Making/ A&P Clinical Course as of 04/26/23 Andre Reed Apr 26, 2023  [id number] This is a very pleasant 19-year-old male presents to the ED complaining of right upper leg pain.  This been ongoing for several days, his doctor prescribed him prednisone which seem to be helping his pain but today ensured she began having again aching pain in his right upper leg.  He feels that it is coming from his hip to about his knee.  He denies any falls or traumas.  He had x-rays performed at the doctor's office.  He denies history of DVT.  On exam the patient appears comfortable in the room.  He is full range of motion at the hip.  No evidence of deformity or fracture.  I do not see an indication for x-ray imaging.  No red flags for cauda equina syndrome or weakness of the lower extremity or neurological defects.  I do think that an ultrasound be reasonable to exclude DVT, otherwise the patient can follow-up with his PCP for presumed sciatica radiculopathy.  The patient is in agreement [MT]    Clinical Course User Index [MT] Trifan, Kermit Balo, MD                             Medical Decision Making  This patient presents to the ED for concern of right hip/leg pain, this involves an extensive number of treatment options, and is a complaint that carries with it a high risk of complications and morbidity.  The differential diagnosis includes hemarthrosis, gout, septic joint, fracture, tendonitis, carpal tunnel syndrome, muscle strain,  bursitis, compartment syndrome   Co morbidities that complicate the patient evaluation  Degenerative disc disease    Imaging Studies ordered:  I ordered imaging studies including US venous Lower Unilateral -pending I independently visualized and interpreted imaging Shared findings with patient I agree with the radiologist interpretation    Problem List /  ED Course / Critical interventions / Medication management  Patient presenting to ED complaining of right hip pain extending to mid thigh area. History of DDD. Physical exam unremarkable - strength and neurovasculature intact. Wells DVT score 0. Patient able to ambulate on leg. Obtaining US to further evaluate for DVT.   Ddx these are considered less likely due to history of present illness and physical exam -hemarthrosis: joint without swelling; ROM intact -gout: no warmth or erythema; ROM intact  -septic joint: afebrile; no warmth or erythema; no skin changes; ROM intact  -fracture: patient able to ambulate on right leg -compartment syndrome: area not tense; neurovascularly intact   7:02 PM Care of Andre Reed transferred to Dr. Renaye Rakers at the end of my shift as the patient will require reassessment once labs/imaging have resulted. Patient presentation, ED course, and plan of care discussed with review of all pertinent labs and imaging. Please see his/her note for further details regarding further ED course and disposition. Plan at time of handoff is reassess after [place] results. If blood clot present, patient may need outpatient anticoagulants vs vascular surgery depending on severity of possible DVT. This may be altered or completely changed at the discretion of the oncoming team pending results of further workup.    Social Determinants of Health:  none           Final Clinical Impression(s) / ED Diagnoses Final diagnoses:  None    Rx / DC Orders ED Discharge Orders     None         Margarita Rana 04/26/23 1903    Terald Sleeper, MD 04/26/23 2005

## 2023-05-12 ENCOUNTER — Encounter (HOSPITAL_COMMUNITY): Payer: Self-pay

## 2023-05-12 ENCOUNTER — Emergency Department (HOSPITAL_COMMUNITY)
Admission: EM | Admit: 2023-05-12 | Discharge: 2023-05-12 | Disposition: A | Discharge status: Home / Self Care | Payer: Medicare HMO | Attending: Emergency Medicine | Admitting: Emergency Medicine

## 2023-05-12 ENCOUNTER — Emergency Department (HOSPITAL_COMMUNITY): Payer: Medicare HMO

## 2023-05-12 ENCOUNTER — Other Ambulatory Visit: Payer: Self-pay

## 2023-05-12 DIAGNOSIS — Z79899 Other long term (current) drug therapy: Secondary | ICD-10-CM | POA: Insufficient documentation

## 2023-05-12 DIAGNOSIS — Z7984 Long term (current) use of oral hypoglycemic drugs: Secondary | ICD-10-CM | POA: Diagnosis not present

## 2023-05-12 DIAGNOSIS — I10 Essential (primary) hypertension: Secondary | ICD-10-CM | POA: Diagnosis not present

## 2023-05-12 DIAGNOSIS — E119 Type 2 diabetes mellitus without complications: Secondary | ICD-10-CM | POA: Diagnosis not present

## 2023-05-12 DIAGNOSIS — M9963 Osseous and subluxation stenosis of intervertebral foramina of lumbar region: Secondary | ICD-10-CM | POA: Insufficient documentation

## 2023-05-12 DIAGNOSIS — M545 Low back pain, unspecified: Secondary | ICD-10-CM | POA: Diagnosis present

## 2023-05-12 DIAGNOSIS — M48061 Spinal stenosis, lumbar region without neurogenic claudication: Secondary | ICD-10-CM

## 2023-05-12 MED ORDER — CYCLOBENZAPRINE HCL 10 MG PO TABS: 10.0000 mg | ORAL_TABLET | Freq: Two times a day (BID) | ORAL | 0 refills | Status: DC | PRN

## 2023-05-12 MED ORDER — LIDOCAINE 5 % EX PTCH
1.0000 | MEDICATED_PATCH | CUTANEOUS | Status: DC
  Administered 2023-05-12: 1 via TRANSDERMAL
  Filled 2023-05-12: qty 1

## 2023-05-12 MED ORDER — OXYCODONE-ACETAMINOPHEN 5-325 MG PO TABS
1.0000 | ORAL_TABLET | Freq: Once | ORAL | Status: AC
  Administered 2023-05-12: 1 via ORAL
  Filled 2023-05-12: qty 1

## 2023-05-12 MED ORDER — HYDROMORPHONE HCL 1 MG/ML IJ SOLN
1.0000 mg | Freq: Once | INTRAMUSCULAR | Status: AC
  Administered 2023-05-12: 1 mg via INTRAVENOUS
  Filled 2023-05-12: qty 1

## 2023-05-12 MED ORDER — PREDNISONE 10 MG (21) PO TBPK: ORAL_TABLET | Freq: Every day | ORAL | 0 refills | Status: DC

## 2023-05-12 MED ORDER — OXYCODONE-ACETAMINOPHEN 5-325 MG PO TABS: 1.0000 | ORAL_TABLET | Freq: Four times a day (QID) | ORAL | 0 refills | Status: DC | PRN

## 2023-05-12 MED ORDER — GADOBUTROL 1 MMOL/ML IV SOLN
9.0000 mL | Freq: Once | INTRAVENOUS | Status: AC | PRN
  Administered 2023-05-12: 9 mL via INTRAVENOUS

## 2023-05-12 MED ORDER — LORAZEPAM 1 MG PO TABS
1.0000 mg | ORAL_TABLET | Freq: Once | ORAL | Status: AC
  Administered 2023-05-12: 1 mg via ORAL
  Filled 2023-05-12: qty 1

## 2023-05-12 NOTE — ED Triage Notes (Addendum)
Pt c/o right lower back pain that radiates down right legx30mos. Pt states right hand is [id number]. Pt denies injury. Pt denies loss of bowel or bladder.

## 2023-05-12 NOTE — Discharge Instructions (Addendum)
Your MRI today showed nerve impingement in the lower back area.  Please take your medications as prescribed. Take tylenol/ibuprofen or Percocet as needed for pain. I recommend close follow-up with orthopedics as soon as possible for reevaluation.  Please do not hesitate to return to emergency department if worrisome signs symptoms we discussed become apparent.

## 2023-05-12 NOTE — ED Provider Notes (Signed)
[place] EMERGENCY DEPARTMENT AT [facility] Provider Note   CSN: [id number] Arrival date & time: 05/12/23  1225     History  Chief Complaint  Patient presents with   Hip Pain   Leg Pain    Andre Reed is a 72 y.o. male with a past medical history of diabetes, hypertension presents today for evaluation of back pain.  Patient reports worsening back pain in the last 10 days.  Pain does go from his right lower back and radiate out to his right leg.  Denies any urinary retention, bowel incontinence, saddle anesthesia, distal weakness.  States that he cannot lay flat for more than 30 minutes.  States that he is unable to sleep at night due to pain.  Reports that he has tried gabapentin, Tylenol with no relief.  Patient follow up with [facility] who told him that they order an MRI but none was scheduled.  Patient states he was advised to come to the ER for a "MRI with sedation" as he wont be able to lay flat for 20 minutes.  Of note patient had an ultrasound of right leg on 5//2024 which showed no DVT.   Hip Pain  Leg Pain     Past Medical History:  Diagnosis Date   DDD (degenerative disc disease), cervical    Diabetes mellitus    Hypertension    History reviewed. No pertinent surgical history.   Home Medications Prior to Admission medications   Medication Sig Start Date End Date Taking? Authorizing Provider  cyclobenzaprine (FLEXERIL) 10 MG tablet Take 1 tablet (10 mg total) by mouth 2 (two) times daily as needed for muscle spasms. 05/12/23  Yes Jeanelle Malling, PA  oxyCODONE-acetaminophen (PERCOCET/ROXICET) 5-325 MG tablet Take 1 tablet by mouth every 6 (six) hours as needed for severe pain. 05/12/23  Yes Jeanelle Malling, PA  predniSONE (STERAPRED UNI-PAK 21 TAB) 10 MG (21) TBPK tablet Take by mouth daily. Take 6 tabs by mouth daily  for 2 days, then 5 tabs for 2 days, then 4 tabs for 2 days, then 3 tabs for 2 days, 2 tabs for 2 days, then 1 tab by mouth daily for 2 days 05/12/23  Yes Jeanelle Malling, PA  atorvastatin (LIPITOR) 40 MG tablet Take 40 mg by mouth daily.    [provider]  azithromycin (ZITHROMAX) 250 MG tablet Take 1 tablet (250 mg total) by mouth daily. Take first 2 tablets together, then 1 every day until finished. 11/15/22   Ernie Avena, MD  benzonatate (TESSALON) 100 MG capsule Take 1 capsule (100 mg total) by mouth every 8 (eight) hours. 11/15/22   Ernie Avena, MD  gabapentin (NEURONTIN) 100 MG capsule Take 1 capsule (100 mg total) by mouth 3 (three) times daily as needed for up to 30 doses. 04/26/23   Terald Sleeper, MD  hydrochlorothiazide (HYDRODIURIL) 25 MG tablet Take 25 mg by mouth daily.  11/17/12   Hayden Rasmussen, NP  ibuprofen (ADVIL,MOTRIN) 800 MG tablet Take 1 tablet (800 mg total) by mouth 3 (three) times daily. Patient not taking: Reported on 04/22/2022 04/16/17   Felicie Morn, NP  JARDIANCE 25 MG TABS tablet Take 25 mg by mouth daily. 01/30/22   [provider]  losartan (COZAAR) 50 MG tablet Take 50 mg by mouth daily. 02/23/22   [provider]  meloxicam (MOBIC) 7.5 MG tablet Take 2 tablets (15 mg total) by mouth daily as needed for pain. Patient not taking: Reported on 04/22/2022 11/21/15  Raeford Razor, MD  metFORMIN (GLUCOPHAGE) 1000 MG tablet Take 1,000 mg by mouth 2 (two) times daily with a meal.    [provider]  methocarbamol (ROBAXIN) 500 MG tablet Take 1 tablet (500 mg total) by mouth at bedtime. 04/22/22   Blue, Soijett A, PA-C  rosuvastatin (CRESTOR) 20 MG tablet Take 20 mg by mouth daily. 03/29/22   [provider]  RYBELSUS 14 MG TABS Take 1 tablet by mouth daily. 01/30/22   [provider]      Allergies    Patient has no known allergies.    Review of Systems   Review of Systems Negative except as per HPI.  Physical Exam Updated Vital Signs BP 138/78 (BP Location: Right Arm)   Pulse 98   Temp 98.2 F (36.8 C) (Oral)   Resp 16   Ht 6' (1.829 m)   Wt 89.8 kg   SpO2 100%    BMI 26.85 kg/m  Physical Exam Vitals and nursing note reviewed.  Constitutional:      Appearance: Normal appearance.  HENT:     Head: Normocephalic and atraumatic.     Mouth/Throat:     Mouth: Mucous membranes are moist.  Eyes:     General: No scleral icterus. Cardiovascular:     Rate and Rhythm: Normal rate and regular rhythm.     Pulses: Normal pulses.     Heart sounds: Normal heart sounds.  Pulmonary:     Effort: Pulmonary effort is normal.     Breath sounds: Normal breath sounds.  Abdominal:     General: Abdomen is flat.     Palpations: Abdomen is soft.     Tenderness: There is no abdominal tenderness.  Musculoskeletal:        General: No deformity.     Comments: Tenderness to palpation to right lower back.  Skin:    General: Skin is warm.     Findings: No rash.  Neurological:     General: No focal deficit present.     Mental Status: He is alert.  Psychiatric:        Mood and Affect: Mood normal.     ED Results / Procedures / Treatments   Labs (all labs ordered are listed, but only abnormal results are displayed) Labs Reviewed - No data to display  EKG None  Radiology MR Lumbar Spine W Wo Contrast  Result Date: 05/12/2023 CLINICAL DATA:  Low back pain, cancer suspected EXAM: MRI LUMBAR SPINE WITHOUT AND WITH CONTRAST TECHNIQUE: Multiplanar and multiecho pulse sequences of the lumbar spine were obtained without and with intravenous contrast. CONTRAST:  9mL GADAVIST GADOBUTROL 1 MMOL/ML IV SOLN COMPARISON:  Lumbar spine radiograph dated 05/12/2020 FINDINGS: Segmentation: There is lumbarization of S1 with last well-formed disc space labeled S1-S2 Alignment:  Physiologic. Vertebrae:  No fracture, evidence of discitis, or bone lesion. Conus medullaris and cauda equina: Conus extends to the L1-L2 disc space level. Conus and cauda equina appear normal. Paraspinal and other soft tissues: Negative. Disc levels: T12-L1: Only imaged in the sagittal plane. No evidence of  high-grade spinal canal or neural foraminal stenosis. L1-L2: Mild bilateral facet degenerative change. No significant disc bulge. No evidence of high-grade spinal canal or neural foraminal stenosis. L2-L3: Mild bilateral facet degenerative change. No significant disc bulge. No evidence of high-grade spinal canal or neural foraminal stenosis L3-L4: Mild bilateral facet degenerative change. No significant disc bulge. No evidence of high-grade spinal canal or neural foraminal stenosis L4-L5: Eccentric right disc bulge with  a foraminal component that results in severe right neural foraminal narrowing mass effect on the exiting right L4 nerve root (series 8, image 19). No evidence of spinal canal narrowing. No left neural foraminal narrowing. Moderate bilateral facet degenerative change with ligamentum flavum hypertrophy. L5-S1: Circumferential disc bulge with an annular fissure. Mild bilateral facet degenerative change. There is narrowing of the right lateral recess. Mild bilateral neural foraminal narrowing. No evidence spinal canal narrowing. S1-S2: Mild bilateral facet degenerative change. No spinal canal or neural foraminal stenosis. IMPRESSION: 1. No evidence of metastatic disease in the lumbar spine. 2. Eccentric right disc bulge at L4-L5 with a foraminal component that results in severe right neural foraminal narrowing with mass effect on the exiting right L4 nerve root. There is also narrowing of the right lateral recess at this level. 3. Circumferential disc bulge with an annular fissure at L5-S1 with narrowing of the right lateral recess and mild bilateral neural foraminal narrowing. Electronically Signed   By: Lorenza Cambridge M.D.   On: 05/12/2023 20:49    Procedures Procedures    Medications Ordered in ED Medications  lidocaine (LIDODERM) 5 % 1 patch (1 patch Transdermal Patch Applied 05/12/23 1854)  LORazepam (ATIVAN) tablet 1 mg (1 mg Oral Given 05/12/23 1854)  oxyCODONE-acetaminophen  (PERCOCET/ROXICET) 5-325 MG per tablet 1 tablet (1 tablet Oral Given 05/12/23 1854)  HYDROmorphone (DILAUDID) injection 1 mg (1 mg Intravenous Given 05/12/23 1944)  gadobutrol (GADAVIST) 1 MMOL/ML injection 9 mL (9 mLs Intravenous Contrast Given 05/12/23 2030)    ED Course/ Medical Decision Making/ A&P                             Medical Decision Making Amount and/or Complexity of Data Reviewed Radiology: ordered.  Risk Prescription drug management.   This patient presents to the ED for low back pain, right leg pain, this involves an extensive number of treatment options, and is a complaint that carries with a high risk of complications and morbidity.  The differential diagnosis includes spinal stenosis, foraminal stenosis, cauda equina, herniated disc, fracture, dislocation, neuropathy, radiculopathy.  This is not an exhaustive list.   Imaging studies: I ordered imaging studies. I personally reviewed, interpreted imaging and agree with the radiologist's interpretations. The results include: MRI of lower back is significant for right neural foraminal narrowing at the level of L4-L5 disc bulging with an annular fissure at L5-S1 with narrowing of the right lateral recess and bilateral neural foraminal narrowing.  Problem list/ ED course/ Critical interventions/ Medical management: HPI: See above Vital signs within normal range and stable throughout visit. Laboratory/imaging studies significant for: See above. On physical examination, patient is afebrile and appears in no acute distress.  This patient presents with back pain for the past 10 days most consistent with lumbar spine stenosis.  No back pain red flags on history and physical examination. MRI of lower back is significant for right neural foraminal narrowing at the level of L4-L5 disc bulging with an annular fissure at L5-S1 with narrowing of the right lateral recess and bilateral neural foraminal narrowing.  Low suspicion for fracture or  dislocation based on MRI results.  Unlikely cauda equina as patient has no urinary retention, bowel incontinence, distal weakness, saddle anesthesia, MRI negative for cauda equina.  Unlikely kidney stones as patient has no urinary symptoms.  Given Percocet, Dilaudid and Ativan prior to MRI as patient was in significant pain.  Reevaluation of patient after these medications  showed that the patient improved.  I sent an Rx of Percocet, Flexeril and Sterapred.  Advised patient to follow-up with orthopedics for further evaluation management.  Strict return precaution discussed. I have reviewed the patient home medicines and have made adjustments as needed.  Cardiac monitoring/EKG: The patient was maintained on a cardiac monitor.  I personally reviewed and interpreted the cardiac monitor which showed an underlying rhythm of: sinus rhythm.  Additional history obtained: External records from outside source obtained and reviewed including: Chart review including previous notes, labs, imaging.  Consultations obtained:  Disposition Continued outpatient therapy. Follow-up with orthopedics recommended for reevaluation of symptoms. Treatment plan discussed with patient.  Pt acknowledged understanding was agreeable to the plan. Worrisome signs and symptoms were discussed with patient, and patient acknowledged understanding to return to the ED if they noticed these signs and symptoms. Patient was stable upon discharge.   This chart was dictated using voice recognition software.  Despite best efforts to proofread,  errors can occur which can change the documentation meaning.          Final Clinical Impression(s) / ED Diagnoses Final diagnoses:  Foraminal stenosis of lumbar region    Rx / DC Orders ED Discharge Orders          Ordered    predniSONE (STERAPRED UNI-PAK 21 TAB) 10 MG (21) TBPK tablet  Daily        05/12/23 2105    oxyCODONE-acetaminophen (PERCOCET/ROXICET) 5-325 MG tablet  Every 6 hours  PRN        05/12/23 2105    cyclobenzaprine (FLEXERIL) 10 MG tablet  2 times daily PRN        05/12/23 2105              Jeanelle Malling, PA 05/12/23 2121    Gloris Manchester, MD 05/13/23 1601

## 2023-05-12 NOTE — ED Notes (Addendum)
Pt back from MRI 

## 2023-05-12 NOTE — ED Provider Triage Note (Signed)
Emergency Medicine Provider Triage Evaluation Note  Andre Reed , a 72 y.o. male  was evaluated in triage.  Pt complains of right low back pain that radiates down right lower extremity.  Ongoing for about 10 days.  He states he is awaiting to get an MRI which has not been done yet.  He is pain medication at home, as well as done a steroid Dosepak with mild relief.  Denies signs or symptoms that raise suspicion for cauda equina, or spinal epidural abscess.  Review of Systems  Positive: As above Negative: As above  Physical Exam  BP 135/87 (BP Location: Right Arm)   Pulse (!) 108   Temp 98.2 F (36.8 C) (Oral)   Resp (!) 22   Ht 6' (1.829 m)   Wt 89.8 kg   SpO2 100%   BMI 26.85 kg/m  Gen:   Awake, no distress   Resp:  Normal effort  MSK:   Moves extremities without difficulty  Other:    Medical Decision Making  Medically screening exam initiated at 12:52 PM.  Appropriate orders placed.  Andre Reed was informed that the remainder of the evaluation will be completed by another provider, this initial triage assessment does not replace that evaluation, and the importance of remaining in the ED until their evaluation is complete.     Marita [place], PA-C 05/12/23 1253

## 2023-05-13 ENCOUNTER — Other Ambulatory Visit (HOSPITAL_COMMUNITY): Payer: Self-pay | Admitting: Family Medicine

## 2023-05-13 ENCOUNTER — Telehealth: Payer: Self-pay | Admitting: Family Medicine

## 2023-05-13 DIAGNOSIS — M5416 Radiculopathy, lumbar region: Secondary | ICD-10-CM

## 2023-05-13 NOTE — Telephone Encounter (Signed)
Per call to patient this morning to Set up appt for MRI W/ Anes.  He stated that he already had this done at [facility] on 05/12/23.  Order has been faxed back to ordering provider's office and request has been cancelled/purged

## 2023-06-01 ENCOUNTER — Ambulatory Visit (HOSPITAL_COMMUNITY): Payer: Self-pay | Admitting: Orthopedic Surgery

## 2023-09-09 ENCOUNTER — Encounter: Payer: Self-pay | Admitting: Neurology

## 2023-09-09 ENCOUNTER — Ambulatory Visit: Payer: Medicare HMO | Admitting: Neurology

## 2023-09-09 VITALS — BP 113/66 | HR 83 | Ht 72.0 in | Wt 183.0 lb

## 2023-09-09 DIAGNOSIS — G5623 Lesion of ulnar nerve, bilateral upper limbs: Secondary | ICD-10-CM

## 2023-09-09 DIAGNOSIS — M5431 Sciatica, right side: Secondary | ICD-10-CM | POA: Diagnosis not present

## 2023-09-09 DIAGNOSIS — M5416 Radiculopathy, lumbar region: Secondary | ICD-10-CM | POA: Diagnosis not present

## 2023-09-09 DIAGNOSIS — R200 Anesthesia of skin: Secondary | ICD-10-CM

## 2023-09-09 NOTE — Patient Instructions (Addendum)
Ulnar neuropathy  Lower symptoms are due to low bacl specifically L5      Cubital Tunnel Syndrome/Ulnar neuropathy  Cubital tunnel syndrome is a condition that causes pain, numbness, tingling, and weakness of the forearm and hand. It happens when your ulnar nerve is irritated or pinched (compressed). The ulnar nerve runs from your shoulder to the small finger (pinkie) of your hand. In most cases, cubital tunnel syndrome is caused by arm motions that are done over and over during sports or while at work. What are the causes? This condition may be caused by: Pressure on the ulnar nerve. This may be from: Repeated elbow bending. Poorly healed broken bones (fractures). Tumors in the elbow. In most cases, these are not cancer. Scar tissue that forms in the elbow after an injury. Bony growths (spurs) near the ulnar nerve. Stretching of the nerve. This may happen when the tissues that connect bones to each other (ligaments) become loose. Trauma to the nerve at the elbow. What increases the risk? You may be more likely to get this condition if: You do manual labor and have to bend your elbow a lot. You play sports that include a lot of throwing motions, such as baseball. You play contact sports, such as football or lacrosse. You do not warm up enough before you do activities. You have diabetes. You have hypothyroidism. This is when your thyroid does not make enough hormones. What are the signs or symptoms? Symptoms of this condition include: Clumsiness or weakness of the hand. You also may not be able to grip or pinch firmly. Aching, soreness, or tenderness of the inner elbow, forearm, or fingers. You may feel this most in your pinkie or ring finger. More pain when you force your elbow to bend or shooting pain from your elbow to your hand. Reduced control when you throw objects. Tingling, numbness, or a burning feeling in your forearm, hand, or fingers. You may feel this most in your  pinkie or ring finger. How is this diagnosed? This condition is diagnosed based on your symptoms, your medical history, and a physical exam. Your health care provider will ask about any injuries. You may also have tests, such as: An electromyogram (EMG). This is done to see how well your nerves are working. A nerve conduction study (NCS). This is done to see how electrical signals pass through your nerves. Imaging tests, such as X-rays, ultrasound, and MRI. These are done to find the source of your nerve problems. How is this treated? This condition may be treated by: Stopping the activities that make your symptoms worse. Icing your elbow. Taking medicines to reduce pain and swelling. Wearing a removable brace. This stops your elbow from bending. Wearing an elbow pad where the ulnar nerve is closest to the skin. Working with a physical therapist. This may help your symptoms get better. It can also help you improve the strength and range of motion of your elbow, forearm, and hand. Steroids. These are medicines that are injected into your body to reduce inflammation. If these treatments do not help, you may need surgery. Follow these instructions at home: Medicines Take over-the-counter and prescription medicines only as told by your provider. Ask your provider if the medicine prescribed to you requires you to avoid driving or using machinery. If you have a removable brace: Wear the brace as told by your provider. Remove it only as told by your provider. Check the skin around the brace every day. Tell your provider about any  concerns. Loosen the brace if your fingers tingle, become numb, or turn cold and blue. Keep the brace clean. If the brace is not waterproof: Do not let it get wet. Cover it with a watertight covering when you take a bath or shower. Managing pain, stiffness, and swelling  If told, put ice on the injured area. If you have a removable brace, remove it as told by your  provider. Put ice in a plastic bag. Place a towel between your skin and the bag. Leave the ice on for 20 minutes, 2-3 times a day. If your skin turns bright red, remove the ice right away to prevent skin damage. The risk of damage is higher if you cannot feel pain, heat, or cold. Move your fingers often to reduce stiffness and swelling. Raise (elevate) the injured area above the level of your heart while you are sitting or lying down. General instructions Do exercises or physical therapy as told by your provider. If you were given an elbow pad or brace, wear it as told by your provider. Keep all follow-up visits. Your provider will check if your symptoms are getting better. They can also adjust the treatment if needed. Contact a health care provider if: Your symptoms get worse. Your symptoms do not get better with treatment. You have new pain. Your hand on the injured side feels numb or cold. This information is not intended to replace advice given to you by your health care provider. Make sure you discuss any questions you have with your health care provider. Document Revised: 09/12/2022 Document Reviewed: 09/12/2022 Elsevier Patient Education  2024 [employer].
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
## 2023-09-13 ENCOUNTER — Encounter: Payer: Self-pay | Admitting: Neurology

## 2023-09-13 DIAGNOSIS — M5416 Radiculopathy, lumbar region: Secondary | ICD-10-CM | POA: Insufficient documentation

## 2023-09-13 DIAGNOSIS — G5623 Lesion of ulnar nerve, bilateral upper limbs: Secondary | ICD-10-CM | POA: Insufficient documentation

## 2023-09-13 DIAGNOSIS — M5431 Sciatica, right side: Secondary | ICD-10-CM | POA: Insufficient documentation

## 2023-09-13 DIAGNOSIS — R200 Anesthesia of skin: Secondary | ICD-10-CM | POA: Insufficient documentation

## 2023-09-14 ENCOUNTER — Telehealth: Payer: Self-pay | Admitting: Neurology

## 2023-09-14 NOTE — Telephone Encounter (Signed)
Referral  for orthopedic surgery fax to [facility]. Phone: [phone], Fax: [phone]

## 2024-11-02 ENCOUNTER — Encounter (HOSPITAL_BASED_OUTPATIENT_CLINIC_OR_DEPARTMENT_OTHER): Payer: Self-pay | Admitting: Emergency Medicine

## 2024-11-02 ENCOUNTER — Emergency Department (HOSPITAL_BASED_OUTPATIENT_CLINIC_OR_DEPARTMENT_OTHER)

## 2024-11-02 ENCOUNTER — Emergency Department (HOSPITAL_BASED_OUTPATIENT_CLINIC_OR_DEPARTMENT_OTHER)
Admission: EM | Admit: 2024-11-02 | Discharge: 2024-11-02 | Disposition: A | Discharge status: Home / Self Care | Attending: Emergency Medicine | Admitting: Emergency Medicine

## 2024-11-02 ENCOUNTER — Other Ambulatory Visit: Payer: Self-pay

## 2024-11-02 DIAGNOSIS — I10 Essential (primary) hypertension: Secondary | ICD-10-CM | POA: Insufficient documentation

## 2024-11-02 DIAGNOSIS — Z7984 Long term (current) use of oral hypoglycemic drugs: Secondary | ICD-10-CM | POA: Insufficient documentation

## 2024-11-02 DIAGNOSIS — E119 Type 2 diabetes mellitus without complications: Secondary | ICD-10-CM | POA: Insufficient documentation

## 2024-11-02 DIAGNOSIS — Z79899 Other long term (current) drug therapy: Secondary | ICD-10-CM | POA: Insufficient documentation

## 2024-11-02 DIAGNOSIS — M545 Low back pain, unspecified: Secondary | ICD-10-CM | POA: Insufficient documentation

## 2024-11-02 DIAGNOSIS — M542 Cervicalgia: Secondary | ICD-10-CM | POA: Insufficient documentation

## 2024-11-02 DIAGNOSIS — Z8673 Personal history of transient ischemic attack (TIA), and cerebral infarction without residual deficits: Secondary | ICD-10-CM | POA: Insufficient documentation

## 2024-11-02 DIAGNOSIS — Y92410 Unspecified street and highway as the place of occurrence of the external cause: Secondary | ICD-10-CM | POA: Insufficient documentation

## 2024-11-02 MED ORDER — METHOCARBAMOL 500 MG PO TABS: 500.0000 mg | ORAL_TABLET | Freq: Two times a day (BID) | ORAL | 0 refills | Status: AC

## 2024-11-02 MED ORDER — ACETAMINOPHEN 325 MG PO TABS
650.0000 mg | ORAL_TABLET | Freq: Once | ORAL | Status: AC
  Administered 2024-11-02: 650 mg via ORAL
  Filled 2024-11-02: qty 2

## 2024-11-02 MED ORDER — NAPROXEN 500 MG PO TABS: 500.0000 mg | ORAL_TABLET | Freq: Two times a day (BID) | ORAL | 0 refills | Status: AC

## 2024-11-02 MED ORDER — IBUPROFEN 400 MG PO TABS
400.0000 mg | ORAL_TABLET | Freq: Once | ORAL | Status: AC
  Administered 2024-11-02: 400 mg via ORAL
  Filled 2024-11-02: qty 1

## 2024-11-02 NOTE — ED Notes (Signed)
 C-Collar applied
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
## 2024-11-02 NOTE — ED Notes (Signed)
 Patient transported to CT

## 2024-11-02 NOTE — ED Notes (Signed)
 D/c instructions reviewed with provider.
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
## 2024-11-02 NOTE — Discharge Instructions (Addendum)
 It was a pleasure taking care of you today.  As discussed, your CT scan did not show any injuries.  Continue taking your oxycodone  as previously prescribed for pain.  I am sending you home with a muscle relaxer and naproxen .  Muscle relaxer can cause drowsiness so do not drive or operate machinery while on the medication.  Please follow-up with PCP for recheck in the next few days.  Return to the ER for any worsening symptoms.
